# Patient Record
Sex: Male | Born: 1975 | Race: White | Hispanic: No | Marital: Married | Smoking: Current every day smoker
Health system: Southern US, Community
[De-identification: ages and names within clinical notes are randomized; demographics above are authoritative.]

## PROBLEM LIST (undated history)

## (undated) DIAGNOSIS — E7800 Pure hypercholesterolemia, unspecified: Secondary | ICD-10-CM

## (undated) DIAGNOSIS — K429 Umbilical hernia without obstruction or gangrene: Secondary | ICD-10-CM

## (undated) DIAGNOSIS — I10 Essential (primary) hypertension: Secondary | ICD-10-CM

## (undated) DIAGNOSIS — E119 Type 2 diabetes mellitus without complications: Secondary | ICD-10-CM

## (undated) DIAGNOSIS — G809 Cerebral palsy, unspecified: Secondary | ICD-10-CM

## (undated) DIAGNOSIS — M1990 Unspecified osteoarthritis, unspecified site: Secondary | ICD-10-CM

## (undated) HISTORY — PX: HEEL SPUR SURGERY: SHX665

## (undated) HISTORY — PX: APPENDECTOMY: SHX54

---

## 2004-11-25 ENCOUNTER — Emergency Department (HOSPITAL_COMMUNITY): Admission: EM | Admit: 2004-11-25 | Discharge: 2004-11-25 | Payer: Self-pay | Admitting: Emergency Medicine

## 2004-11-28 ENCOUNTER — Ambulatory Visit: Payer: Self-pay | Admitting: Nurse Practitioner

## 2005-05-06 ENCOUNTER — Emergency Department (HOSPITAL_COMMUNITY): Admission: EM | Admit: 2005-05-06 | Discharge: 2005-05-06 | Payer: Self-pay | Admitting: Emergency Medicine

## 2005-08-15 ENCOUNTER — Ambulatory Visit: Payer: Self-pay | Admitting: Family Medicine

## 2005-08-20 ENCOUNTER — Ambulatory Visit (HOSPITAL_COMMUNITY): Admission: RE | Admit: 2005-08-20 | Discharge: 2005-08-20 | Payer: Self-pay | Admitting: Family Medicine

## 2005-10-08 ENCOUNTER — Ambulatory Visit: Payer: Self-pay | Admitting: Family Medicine

## 2005-10-16 ENCOUNTER — Ambulatory Visit: Admission: RE | Admit: 2005-10-16 | Discharge: 2005-10-16 | Payer: Self-pay | Admitting: Family Medicine

## 2005-10-22 ENCOUNTER — Ambulatory Visit: Payer: Self-pay | Admitting: Family Medicine

## 2006-02-04 ENCOUNTER — Emergency Department (HOSPITAL_COMMUNITY): Admission: EM | Admit: 2006-02-04 | Discharge: 2006-02-04 | Payer: Self-pay | Admitting: Emergency Medicine

## 2006-02-09 ENCOUNTER — Emergency Department (HOSPITAL_COMMUNITY): Admission: EM | Admit: 2006-02-09 | Discharge: 2006-02-09 | Payer: Self-pay | Admitting: Emergency Medicine

## 2006-10-29 ENCOUNTER — Emergency Department (HOSPITAL_COMMUNITY): Admission: EM | Admit: 2006-10-29 | Discharge: 2006-10-29 | Payer: Self-pay | Admitting: Emergency Medicine

## 2006-12-20 ENCOUNTER — Emergency Department (HOSPITAL_COMMUNITY): Admission: EM | Admit: 2006-12-20 | Discharge: 2006-12-20 | Payer: Self-pay | Admitting: Emergency Medicine

## 2007-04-11 ENCOUNTER — Emergency Department (HOSPITAL_COMMUNITY): Admission: EM | Admit: 2007-04-11 | Discharge: 2007-04-11 | Payer: Self-pay | Admitting: Emergency Medicine

## 2007-04-27 ENCOUNTER — Emergency Department (HOSPITAL_COMMUNITY): Admission: EM | Admit: 2007-04-27 | Discharge: 2007-04-27 | Payer: Self-pay | Admitting: Emergency Medicine

## 2007-05-27 ENCOUNTER — Ambulatory Visit: Admission: RE | Admit: 2007-05-27 | Discharge: 2007-05-27 | Payer: Self-pay | Admitting: Internal Medicine

## 2007-07-04 ENCOUNTER — Emergency Department (HOSPITAL_COMMUNITY): Admission: EM | Admit: 2007-07-04 | Discharge: 2007-07-05 | Payer: Self-pay | Admitting: Emergency Medicine

## 2007-07-12 ENCOUNTER — Emergency Department (HOSPITAL_COMMUNITY): Admission: EM | Admit: 2007-07-12 | Discharge: 2007-07-12 | Payer: Self-pay | Admitting: Emergency Medicine

## 2007-09-04 ENCOUNTER — Emergency Department (HOSPITAL_COMMUNITY): Admission: EM | Admit: 2007-09-04 | Discharge: 2007-09-04 | Payer: Self-pay | Admitting: Emergency Medicine

## 2007-11-04 ENCOUNTER — Emergency Department (HOSPITAL_COMMUNITY): Admission: EM | Admit: 2007-11-04 | Discharge: 2007-11-04 | Payer: Self-pay | Admitting: Emergency Medicine

## 2008-01-22 ENCOUNTER — Emergency Department (HOSPITAL_COMMUNITY): Admission: EM | Admit: 2008-01-22 | Discharge: 2008-01-22 | Payer: Self-pay | Admitting: Emergency Medicine

## 2008-03-28 ENCOUNTER — Emergency Department (HOSPITAL_COMMUNITY): Admission: EM | Admit: 2008-03-28 | Discharge: 2008-03-28 | Payer: Self-pay | Admitting: Emergency Medicine

## 2008-07-12 ENCOUNTER — Emergency Department (HOSPITAL_COMMUNITY): Admission: EM | Admit: 2008-07-12 | Discharge: 2008-07-12 | Payer: Self-pay | Admitting: Emergency Medicine

## 2008-08-03 ENCOUNTER — Emergency Department (HOSPITAL_COMMUNITY): Admission: EM | Admit: 2008-08-03 | Discharge: 2008-08-03 | Payer: Self-pay | Admitting: Emergency Medicine

## 2008-08-11 ENCOUNTER — Emergency Department (HOSPITAL_COMMUNITY): Admission: EM | Admit: 2008-08-11 | Discharge: 2008-08-11 | Payer: Self-pay | Admitting: Emergency Medicine

## 2008-10-28 ENCOUNTER — Emergency Department (HOSPITAL_COMMUNITY): Admission: EM | Admit: 2008-10-28 | Discharge: 2008-10-28 | Payer: Self-pay | Admitting: Emergency Medicine

## 2009-05-06 ENCOUNTER — Emergency Department (HOSPITAL_COMMUNITY): Admission: EM | Admit: 2009-05-06 | Discharge: 2009-05-06 | Payer: Self-pay | Admitting: Emergency Medicine

## 2010-04-18 ENCOUNTER — Emergency Department (HOSPITAL_COMMUNITY)
Admission: EM | Admit: 2010-04-18 | Discharge: 2010-04-18 | Payer: Self-pay | Source: Home / Self Care | Admitting: Emergency Medicine

## 2010-06-12 ENCOUNTER — Emergency Department (HOSPITAL_COMMUNITY)
Admission: EM | Admit: 2010-06-12 | Discharge: 2010-06-12 | Payer: Self-pay | Source: Home / Self Care | Admitting: Emergency Medicine

## 2010-06-19 LAB — URINALYSIS, ROUTINE W REFLEX MICROSCOPIC
Bilirubin Urine: NEGATIVE
Hgb urine dipstick: NEGATIVE
Ketones, ur: NEGATIVE mg/dL
Nitrite: NEGATIVE
Protein, ur: NEGATIVE mg/dL
Specific Gravity, Urine: >1.03 — ABNORMAL HIGH (ref 1.005–1.030)
Urine Glucose, Fasting: NEGATIVE mg/dL
Urobilinogen, UA: 2 mg/dL — ABNORMAL HIGH (ref 0.0–1.0)
pH: 6 (ref 5.0–8.0)

## 2010-06-19 LAB — CBC
HCT: 37.3 % — ABNORMAL LOW (ref 39.0–52.0)
Hemoglobin: 13.2 g/dL (ref 13.0–17.0)
MCH: 30.9 pg (ref 26.0–34.0)
MCHC: 35.4 g/dL (ref 30.0–36.0)
MCV: 87.4 fL (ref 78.0–100.0)
Platelets: 215 10*3/uL (ref 150–400)
RBC: 4.27 MIL/uL (ref 4.22–5.81)
RDW: 13.6 % (ref 11.5–15.5)
WBC: 6.8 10*3/uL (ref 4.0–10.5)

## 2010-06-19 LAB — DIFFERENTIAL
Basophils Absolute: 0 10*3/uL (ref 0.0–0.1)
Basophils Relative: 0 % (ref 0–1)
Eosinophils Absolute: 0.2 10*3/uL (ref 0.0–0.7)
Eosinophils Relative: 3 % (ref 0–5)
Lymphocytes Relative: 28 % (ref 12–46)
Lymphs Abs: 1.9 10*3/uL (ref 0.7–4.0)
Monocytes Absolute: 0.5 10*3/uL (ref 0.1–1.0)
Monocytes Relative: 7 % (ref 3–12)
Neutro Abs: 4.3 10*3/uL (ref 1.7–7.7)
Neutrophils Relative %: 63 % (ref 43–77)

## 2010-06-19 LAB — COMPREHENSIVE METABOLIC PANEL
ALT: 87 U/L — ABNORMAL HIGH (ref 0–53)
AST: 69 U/L — ABNORMAL HIGH (ref 0–37)
Albumin: 4 g/dL (ref 3.5–5.2)
Alkaline Phosphatase: 41 U/L (ref 39–117)
BUN: 11 mg/dL (ref 6–23)
CO2: 25 mEq/L (ref 19–32)
Calcium: 9 mg/dL (ref 8.4–10.5)
Chloride: 108 mEq/L (ref 96–112)
Creatinine, Ser: 0.9 mg/dL (ref 0.4–1.5)
GFR calc Af Amer: >60 mL/min (ref >60)
GFR calc non Af Amer: >60 mL/min (ref >60)
Glucose, Bld: 104 mg/dL — ABNORMAL HIGH (ref 70–99)
Potassium: 3.9 mEq/L (ref 3.5–5.1)
Sodium: 140 mEq/L (ref 135–145)
Total Bilirubin: 0.5 mg/dL (ref 0.3–1.2)
Total Protein: 7 g/dL (ref 6.0–8.3)

## 2010-06-19 LAB — BRAIN NATRIURETIC PEPTIDE: Pro B Natriuretic peptide (BNP): <30 pg/mL (ref 0.0–100.0)

## 2010-06-19 LAB — URIC ACID: Uric Acid, Serum: 7 mg/dL (ref 4.0–7.8)

## 2010-06-19 LAB — D-DIMER, QUANTITATIVE: D-Dimer, Quant: 0.3 ug/mL-FEU (ref 0.00–0.48)

## 2011-02-22 LAB — DIFFERENTIAL
Basophils Absolute: 0.1
Basophils Relative: 1
Eosinophils Absolute: 0.3
Eosinophils Relative: 3
Lymphocytes Relative: 35
Lymphs Abs: 3.1
Monocytes Absolute: 0.5
Monocytes Relative: 6
Neutro Abs: 4.9
Neutrophils Relative %: 55

## 2011-02-22 LAB — CBC
HCT: 44.1
Hemoglobin: 15.8
MCHC: 35.8
MCV: 87.9
Platelets: 263
RBC: 5.02
RDW: 13.1
WBC: 8.9

## 2011-02-22 LAB — BASIC METABOLIC PANEL
BUN: 10
CO2: 27
Calcium: 9.1
Chloride: 107
Creatinine, Ser: 0.81
GFR calc Af Amer: >60
GFR calc non Af Amer: >60
Glucose, Bld: 122 — ABNORMAL HIGH
Potassium: 3.3 — ABNORMAL LOW
Sodium: 140

## 2011-02-22 LAB — POCT CARDIAC MARKERS
CKMB, poc: <1 — ABNORMAL LOW
Myoglobin, poc: 81.2
Operator id: 179121
Troponin i, poc: <0.05

## 2011-03-16 DIAGNOSIS — H6120 Impacted cerumen, unspecified ear: Secondary | ICD-10-CM | POA: Insufficient documentation

## 2011-03-16 DIAGNOSIS — E119 Type 2 diabetes mellitus without complications: Secondary | ICD-10-CM | POA: Insufficient documentation

## 2011-03-17 ENCOUNTER — Encounter: Payer: Self-pay | Admitting: *Deleted

## 2011-03-17 ENCOUNTER — Emergency Department (HOSPITAL_COMMUNITY)
Admission: EM | Admit: 2011-03-17 | Discharge: 2011-03-17 | Disposition: A | Discharge status: Home / Self Care | Payer: Self-pay | Attending: Emergency Medicine | Admitting: Emergency Medicine

## 2011-03-17 DIAGNOSIS — H6123 Impacted cerumen, bilateral: Secondary | ICD-10-CM

## 2011-03-17 HISTORY — DX: Unspecified osteoarthritis, unspecified site: M19.90

## 2011-03-17 HISTORY — DX: Essential (primary) hypertension: I10

## 2011-03-17 HISTORY — DX: Pure hypercholesterolemia, unspecified: E78.00

## 2011-03-17 NOTE — ED Notes (Signed)
Pt c/o wax build up in both his ears which is making it hard to hear.

## 2011-03-17 NOTE — ED Notes (Signed)
Pt left er stating he wont sign discharge papers because the doctor didn't flush his ears. No noted distress

## 2011-03-17 NOTE — ED Provider Notes (Signed)
History     CSN: [id number] Arrival date & time: 03/17/2011 12:14 AM  Chief Complaint  Patient presents with  . Otalgia    The history is provided by the patient.   patient reports a complaining of buildup of wax in both ears ears were hard to hear.  He reports worsening hearing over the past couple days and therefore presents and requests cerumen removal.  Nothing worsens the symptoms.  Nothing improves the symptoms.  His symptoms are mild in severity.  They're constant  Past Medical History  Diagnosis Date  . Hypertension   . High cholesterol   . Premature birth   . Arthritis     Past Surgical History  Procedure Date  . Appendectomy   . Heel spur surgery     Family History  Problem Relation Age of Onset  . Diabetes Other   . Hypertension Other   . Rheum arthritis Other     History  Substance Use Topics  . Smoking status: Current Everyday Smoker  . Smokeless tobacco: Not on file  . Alcohol Use: No      Review of Systems  All other systems reviewed and are negative.    Allergies  Review of patient's allergies indicates not on file.  Home Medications   Current Outpatient Rx  Name Route Sig Dispense Refill  . LISINOPRIL 40 MG PO TABS Oral Take 40 mg by mouth daily.      Marland Kitchen UNKNOWN TO PATIENT         BP 136/79  Pulse 81  Temp(Src) 97.6 F (36.4 C) (Oral)  Resp 20  Wt 250 lb (113.399 kg)  SpO2 97%  Physical Exam  Constitutional: He is oriented to person, place, and time. He appears well-developed and well-nourished.  HENT:  Head: Normocephalic.       Bilateral cerumen impaction right greater than left  Eyes: EOM are normal.  Neck: Normal range of motion.  Pulmonary/Chest: Effort normal.  Musculoskeletal: Normal range of motion.  Neurological: He is alert and oriented to person, place, and time.  Psychiatric: He has a normal mood and affect.    ED Course  EAR CERUMEN REMOVAL Performed by: Lyanne Co Authorized by: Lyanne Co Consent: Verbal consent obtained. Consent given by: patient Patient understanding: patient states understanding of the procedure being performed Required items: required blood products, implants, devices, and special equipment available Patient identity confirmed: arm band Local anesthetic: none Location details: right ear Procedure type: curette Patient sedated: no Comments: The patient tolerated the procedure in the early parts but then began to have too much pain to allow for forward move cerumen on the right.  He refused cerumen removal on the left   (including critical care time)  Labs Reviewed - No data to display No results found.   1. Impacted cerumen of both ears       MDM  A large amount of cerumen was removed from the right side however there was poor to obtain but the patient refused additional wax removal.  He was offered cerumen removal with curette on left as well however he declined.  He requested irrigation however due to the volume in the emergency department as well as the acuity of patient's it was deemed not adequate use of staff.  I recommended that the patient followup with his primary care doctor for further removal in the office.  The patient was upset however I did not feel as though at other options for him  at this time      Lyanne Co, MD 03/17/11 [phone]

## 2011-06-06 ENCOUNTER — Encounter (HOSPITAL_COMMUNITY): Payer: Self-pay | Admitting: Emergency Medicine

## 2011-06-06 DIAGNOSIS — Z79899 Other long term (current) drug therapy: Secondary | ICD-10-CM | POA: Insufficient documentation

## 2011-06-06 DIAGNOSIS — R21 Rash and other nonspecific skin eruption: Secondary | ICD-10-CM | POA: Insufficient documentation

## 2011-06-06 DIAGNOSIS — F17200 Nicotine dependence, unspecified, uncomplicated: Secondary | ICD-10-CM | POA: Insufficient documentation

## 2011-06-06 DIAGNOSIS — M129 Arthropathy, unspecified: Secondary | ICD-10-CM | POA: Insufficient documentation

## 2011-06-06 DIAGNOSIS — L299 Pruritus, unspecified: Secondary | ICD-10-CM | POA: Insufficient documentation

## 2011-06-06 DIAGNOSIS — L02818 Cutaneous abscess of other sites: Secondary | ICD-10-CM | POA: Insufficient documentation

## 2011-06-06 DIAGNOSIS — E789 Disorder of lipoprotein metabolism, unspecified: Secondary | ICD-10-CM | POA: Insufficient documentation

## 2011-06-06 DIAGNOSIS — L03818 Cellulitis of other sites: Secondary | ICD-10-CM | POA: Insufficient documentation

## 2011-06-06 DIAGNOSIS — I10 Essential (primary) hypertension: Secondary | ICD-10-CM | POA: Insufficient documentation

## 2011-06-06 DIAGNOSIS — L989 Disorder of the skin and subcutaneous tissue, unspecified: Secondary | ICD-10-CM | POA: Insufficient documentation

## 2011-06-06 DIAGNOSIS — M79609 Pain in unspecified limb: Secondary | ICD-10-CM | POA: Insufficient documentation

## 2011-06-06 NOTE — ED Notes (Signed)
Knot on left side of head x 1 week  (temporal area)   Left foot ulcerated area below the medial mallelous x 2 month (itches and drains sometimes) no known injury

## 2011-06-07 ENCOUNTER — Emergency Department (HOSPITAL_COMMUNITY)
Admission: EM | Admit: 2011-06-07 | Discharge: 2011-06-07 | Disposition: A | Discharge status: Home / Self Care | Payer: Medicaid Other | Attending: Emergency Medicine | Admitting: Emergency Medicine

## 2011-06-07 DIAGNOSIS — L02811 Cutaneous abscess of head [any part, except face]: Secondary | ICD-10-CM

## 2011-06-07 MED ORDER — KETOROLAC TROMETHAMINE 60 MG/2ML IM SOLN
60.0000 mg | Freq: Once | INTRAMUSCULAR | Status: AC
  Administered 2011-06-07: 60 mg via INTRAMUSCULAR
  Filled 2011-06-07: qty 2

## 2011-06-07 MED ORDER — LIDOCAINE HCL (PF) 1 % IJ SOLN
INTRAMUSCULAR | Status: AC
  Filled 2011-06-07: qty 5

## 2011-06-07 MED ORDER — NAPROXEN 500 MG PO TABS: 500.0000 mg | ORAL_TABLET | Freq: Two times a day (BID) | ORAL | Status: AC

## 2011-06-07 NOTE — ED Notes (Signed)
Knot noted to left temple of patient's head.  Pt denies fall or injury, but pt states he shaved his head and may have nicked the area prior to knot coming up.  Pt does not recall how long ago he shaved hair.

## 2011-06-07 NOTE — ED Provider Notes (Signed)
History     CSN: [id number]  Arrival date & time 06/06/11  2146   First MD Initiated Contact with Patient 06/07/11 0031      Chief Complaint  Patient presents with  . Skin Ulcer  . Recurrent Skin Infections    (Consider location/radiation/quality/duration/timing/severity/associated sxs/prior treatment) HPI Comments: 36 year old male with a history of cerebral palsy , hypertension, arthritis who presents with 2 complaints  #1 swollen lesion to the left frontal temporal area which started one week ago has gradually gotten worse and is associated with drainage of a wet material onto his hair but is not associated with fevers nausea or vomiting. Symptoms are mild and it is nontender to palpation  #2, left medial proximal foot, nonhealing lesion for 2 months, mild tenderness to palpation, sometimes itchy, not draining.  Patient is wheelchair-bound due to inability to ambulate safely do to cerebral palsy.  The history is provided by the patient.    Past Medical History  Diagnosis Date  . Hypertension   . High cholesterol   . Premature birth   . Arthritis     Past Surgical History  Procedure Date  . Appendectomy   . Heel spur surgery     Family History  Problem Relation Age of Onset  . Diabetes Other   . Hypertension Other   . Rheum arthritis Other     History  Substance Use Topics  . Smoking status: Current Everyday Smoker  . Smokeless tobacco: Not on file  . Alcohol Use: No      Review of Systems  All other systems reviewed and are negative.    Allergies  Review of patient's allergies indicates no known allergies.  Home Medications   Current Outpatient Rx  Name Route Sig Dispense Refill  . LISINOPRIL 40 MG PO TABS Oral Take 40 mg by mouth daily.      Marland Kitchen UNKNOWN TO PATIENT       . NAPROXEN 500 MG PO TABS Oral Take 1 tablet (500 mg total) by mouth 2 (two) times daily with a meal. 30 tablet 0    BP 144/99  Pulse 73  Temp(Src) 98.2 F (36.8 C) (Oral)   Resp 20  Ht 5\' 7"  (1.702 m)  Wt 156 lb (70.761 kg)  BMI 24.43 kg/m2  SpO2 100%  Physical Exam  Nursing note and vitals reviewed. Constitutional: He appears well-developed and well-nourished. No distress.  HENT:  Head: Normocephalic.  Mouth/Throat: Oropharynx is clear and moist. No oropharyngeal exudate.       2 cm fluctuant soft nontender lesion to the frontotemporal scalp, no hair follicles overlying this area  Eyes: Conjunctivae and EOM are normal. Pupils are equal, round, and reactive to light. Right eye exhibits no discharge. Left eye exhibits no discharge. No scleral icterus.  Neck: Normal range of motion. Neck supple. No JVD present. No thyromegaly present.  Cardiovascular: Normal rate, regular rhythm, normal heart sounds and intact distal pulses.  Exam reveals no gallop and no friction rub.   No murmur heard. Pulmonary/Chest: Effort normal and breath sounds normal. No respiratory distress. He has no wheezes. He has no rales.  Abdominal: Soft. Bowel sounds are normal. He exhibits no distension and no mass. There is no tenderness.  Musculoskeletal: Normal range of motion. He exhibits no edema and no tenderness.  Lymphadenopathy:    He has no cervical adenopathy.  Neurological: He is alert. Coordination normal.  Skin: Skin is warm and dry. Rash noted. No erythema.  Mild scaling crusting dry lesion to the medial left foot just distal and inferior, posterior to the medial malleoli  Psychiatric: He has a normal mood and affect. His behavior is normal.    ED Course  Procedures (including critical care time)  INCISION AND DRAINAGE Performed by: Eber Hong D Consent: Verbal consent obtained. Risks and benefits: risks, benefits and alternatives were discussed Type: abscess  Body area: left frontal temporal scalp  Anesthesia: local infiltration  Local anesthetic: lidocaine 1%% without epinephrine  Anesthetic total: 0.5 ml  Complexity: complex Blunt dissection to  break up loculations  Drainage: purulent  Drainage amount: small-to-moderate  Packing material: 1/4 in iodoform gauze  Patient tolerance: Patient tolerated the procedure well with no immediate complications.     Labs Reviewed - No data to display No results found.   1. Abscess of scalp       MDM  Patient likely has some peripheral vascular insufficiency of the left foot, there is no signs of bowel perfusion of the foot as the toes are all well perfused. There is no erythema to suggest cellulitis. The swollen abscess-like lesion of the scalp will need incision and drainage. It is too soft and fluctuant to be a dermoid cyst.  No surrounding erythema, no fevers to suggest significant infection, abscess drained, packing placed, patient informed of close followup        Vida Roller, MD 06/07/11 0150

## 2011-06-07 NOTE — ED Notes (Signed)
Patient is alert and oriented x 4 with respirations even and unlabored.  NAD at this time.  Discharge instructions reviewed with patient and patient verbalized understanding.  Pt transported to lobby via wheelchair, and caregiver to transport pt home.

## 2011-06-07 NOTE — ED Notes (Signed)
Set up I and D for Dr. Hyacinth Meeker to lance place on patients head.

## 2011-06-07 NOTE — ED Notes (Signed)
Scab with dark center noted to left inner ankle.  Pt reports area evaluated by EDP.  Pt reports soreness to area with standing.  Left foot is warm and dry with pulse palpable.

## 2012-05-29 ENCOUNTER — Encounter (HOSPITAL_COMMUNITY): Payer: Self-pay | Admitting: Emergency Medicine

## 2012-05-29 ENCOUNTER — Emergency Department (HOSPITAL_COMMUNITY)
Admission: EM | Admit: 2012-05-29 | Discharge: 2012-05-29 | Disposition: A | Discharge status: Home / Self Care | Payer: Medicaid Other | Attending: Emergency Medicine | Admitting: Emergency Medicine

## 2012-05-29 DIAGNOSIS — I10 Essential (primary) hypertension: Secondary | ICD-10-CM | POA: Insufficient documentation

## 2012-05-29 DIAGNOSIS — E7800 Pure hypercholesterolemia, unspecified: Secondary | ICD-10-CM | POA: Insufficient documentation

## 2012-05-29 DIAGNOSIS — R109 Unspecified abdominal pain: Secondary | ICD-10-CM

## 2012-05-29 DIAGNOSIS — M129 Arthropathy, unspecified: Secondary | ICD-10-CM | POA: Insufficient documentation

## 2012-05-29 DIAGNOSIS — Z79899 Other long term (current) drug therapy: Secondary | ICD-10-CM | POA: Insufficient documentation

## 2012-05-29 DIAGNOSIS — M549 Dorsalgia, unspecified: Secondary | ICD-10-CM | POA: Insufficient documentation

## 2012-05-29 DIAGNOSIS — F17200 Nicotine dependence, unspecified, uncomplicated: Secondary | ICD-10-CM | POA: Insufficient documentation

## 2012-05-29 LAB — URINALYSIS, ROUTINE W REFLEX MICROSCOPIC
Glucose, UA: NEGATIVE mg/dL
Leukocytes, UA: NEGATIVE
Nitrite: NEGATIVE
Protein, ur: NEGATIVE mg/dL
pH: 6.5 (ref 5.0–8.0)

## 2012-05-29 NOTE — ED Notes (Signed)
Offered fluids to encourage voiding for urine specimen

## 2012-05-29 NOTE — ED Notes (Signed)
Drove himself here via his motorized wheelchair.  States he dropped his cell phone somewhere en route.  Has other complaints also - states ulcer on heel.  Taking PCN for dental carries and will have tooth extracted after completed

## 2012-05-29 NOTE — ED Provider Notes (Signed)
History     CSN: [id number]  Arrival date & time 05/29/12  4098   First MD Initiated Contact with Patient 05/29/12 [phone]      Chief Complaint  Patient presents with  . Chest Pain    pain left side rib area    (Consider location/radiation/quality/duration/timing/severity/associated sxs/prior treatment) HPI Bruce Love is a 36 y.o. male with a h/o DM, HTN, RA, uses a motorized wheelchair,  who presents to the Emergency Department complaining of left sided abdominal pain that woke him from sleep. He is currently on antibiotic for dental pain. He has no history of kidney stones. Last BM was at 8 PM last night and was normal.    Past Medical History  Diagnosis Date  . Hypertension   . High cholesterol   . Premature birth   . Arthritis     Past Surgical History  Procedure Date  . Appendectomy   . Heel spur surgery     Family History  Problem Relation Age of Onset  . Diabetes Other   . Hypertension Other   . Rheum arthritis Other     History  Substance Use Topics  . Smoking status: Current Every Day Smoker -- 0.5 packs/day  . Smokeless tobacco: Not on file  . Alcohol Use: No      Review of Systems  Constitutional: Negative for fever.       10 Systems reviewed and are negative for acute change except as noted in the HPI.  HENT: Negative for congestion.   Eyes: Negative for discharge and redness.  Respiratory: Negative for cough and shortness of breath.   Cardiovascular: Negative for chest pain.  Gastrointestinal: Negative for vomiting and abdominal pain.  Musculoskeletal: Positive for back pain.       Pain to left side of abdomen  Skin: Negative for rash.  Neurological: Negative for syncope, numbness and headaches.  Psychiatric/Behavioral:       No behavior change.    Allergies  Review of patient's allergies indicates no known allergies.  Home Medications   Current Outpatient Rx  Name  Route  Sig  Dispense  Refill  . AMOXICILLIN 500 MG PO CAPS  Oral   Take 500 mg by mouth 3 (three) times daily.         Marland Kitchen LISINOPRIL 40 MG PO TABS   Oral   Take 40 mg by mouth daily.           Marland Kitchen UNKNOWN TO PATIENT                . NAPROXEN 500 MG PO TABS   Oral   Take 1 tablet (500 mg total) by mouth 2 (two) times daily with a meal.   30 tablet   0     BP 159/89  Pulse 77  Temp 97.7 F (36.5 C) (Oral)  Resp 20  Ht 5\' 7"  (1.702 m)  Wt 210 lb (95.255 kg)  BMI 32.89 kg/m2  SpO2 99%  Physical Exam  Nursing note and vitals reviewed. Constitutional: He appears well-developed and well-nourished.       Awake, alert, nontoxic appearance.  HENT:  Head: Normocephalic and atraumatic.  Right Ear: External ear normal.  Left Ear: External ear normal.  Eyes: EOM are normal. Pupils are equal, round, and reactive to light. Right eye exhibits no discharge. Left eye exhibits no discharge.  Neck: Neck supple.  Cardiovascular: Normal rate, normal heart sounds and intact distal pulses.   Pulmonary/Chest: Effort normal and breath  sounds normal. No respiratory distress. He has no wheezes. He exhibits no tenderness.  Abdominal: Soft. There is no tenderness. There is no rebound.  Musculoskeletal: He exhibits no tenderness.       Baseline ROM, no obvious new focal weakness.  Neurological:       Mental status and motor strength appears baseline for patient and situation.  Skin: No rash noted.  Psychiatric: He has a normal mood and affect.    ED Course  Procedures (including critical care time) Results for orders placed during the hospital encounter of 05/29/12  URINALYSIS, ROUTINE W REFLEX MICROSCOPIC      Component Value Range   Color, Urine YELLOW  YELLOW   APPearance CLEAR  CLEAR   Specific Gravity, Urine 1.020  1.005 - 1.030   pH 6.5  5.0 - 8.0   Glucose, UA NEGATIVE  NEGATIVE mg/dL   Hgb urine dipstick NEGATIVE  NEGATIVE   Bilirubin Urine NEGATIVE  NEGATIVE   Ketones, ur NEGATIVE  NEGATIVE mg/dL   Protein, ur NEGATIVE  NEGATIVE  mg/dL   Urobilinogen, UA 1.0  0.0 - 1.0 mg/dL   Nitrite NEGATIVE  NEGATIVE   Leukocytes, UA NEGATIVE  NEGATIVE       MDM  Patient with left sided abdominal pain that woke him from sleep. Pain subsided without intervention. UA normal. Reviewed results with the patient. Pt stable in ED with no significant deterioration in condition.The patient appears reasonably screened and/or stabilized for discharge and I doubt any other medical condition or other [facility] requiring further screening, evaluation, or treatment in the ED at this time prior to discharge.  MDM Reviewed: nursing note and vitals Interpretation: labs           Nicoletta Dress. Colon Branch, MD 05/29/12 [phone]

## 2012-08-26 ENCOUNTER — Ambulatory Visit (HOSPITAL_COMMUNITY): Admission: RE | Admit: 2012-08-26 | Payer: Medicaid Other | Source: Ambulatory Visit | Admitting: Physical Therapy

## 2012-10-22 ENCOUNTER — Emergency Department (HOSPITAL_COMMUNITY)
Admission: EM | Admit: 2012-10-22 | Discharge: 2012-10-23 | Disposition: A | Discharge status: Home / Self Care | Payer: Medicaid Other | Attending: Emergency Medicine | Admitting: Emergency Medicine

## 2012-10-22 ENCOUNTER — Encounter (HOSPITAL_COMMUNITY): Payer: Self-pay | Admitting: Emergency Medicine

## 2012-10-22 DIAGNOSIS — Z79899 Other long term (current) drug therapy: Secondary | ICD-10-CM | POA: Insufficient documentation

## 2012-10-22 DIAGNOSIS — Z8639 Personal history of other endocrine, nutritional and metabolic disease: Secondary | ICD-10-CM | POA: Insufficient documentation

## 2012-10-22 DIAGNOSIS — I10 Essential (primary) hypertension: Secondary | ICD-10-CM | POA: Insufficient documentation

## 2012-10-22 DIAGNOSIS — F17200 Nicotine dependence, unspecified, uncomplicated: Secondary | ICD-10-CM | POA: Insufficient documentation

## 2012-10-22 DIAGNOSIS — R599 Enlarged lymph nodes, unspecified: Secondary | ICD-10-CM | POA: Insufficient documentation

## 2012-10-22 DIAGNOSIS — Z8739 Personal history of other diseases of the musculoskeletal system and connective tissue: Secondary | ICD-10-CM | POA: Insufficient documentation

## 2012-10-22 DIAGNOSIS — Z862 Personal history of diseases of the blood and blood-forming organs and certain disorders involving the immune mechanism: Secondary | ICD-10-CM | POA: Insufficient documentation

## 2012-10-22 DIAGNOSIS — R590 Localized enlarged lymph nodes: Secondary | ICD-10-CM

## 2012-10-22 MED ORDER — PENICILLIN V POTASSIUM 250 MG PO TABS
500.0000 mg | ORAL_TABLET | Freq: Once | ORAL | Status: AC
  Administered 2012-10-22: 500 mg via ORAL
  Filled 2012-10-22: qty 2

## 2012-10-22 MED ORDER — PENICILLIN V POTASSIUM 500 MG PO TABS: 500.0000 mg | ORAL_TABLET | Freq: Four times a day (QID) | ORAL | Status: AC

## 2012-10-22 NOTE — ED Notes (Signed)
Patient reports noticed a swollen knot to left side of throat after eating a bologna sandwich tonight. Denies difficulty swallowing or breathing.

## 2012-10-22 NOTE — ED Provider Notes (Signed)
History     CSN: [id number]  Arrival date & time 10/22/12  2247   First MD Initiated Contact with Patient 10/22/12 2327      Chief Complaint  Patient presents with  . Oral Swelling    (Consider location/radiation/quality/duration/timing/severity/associated sxs/prior treatment) HPI HPI Comments: Bruce Love is a 37 y.o. male who presents to the Emergency Department complaining of swelling of the lymph node to the left neck that began today. Painful to touch. No obvious lesions in the mouth, face or head. Denies fever, chills, cough. Past Medical History  Diagnosis Date  . Hypertension   . High cholesterol   . Premature birth   . Arthritis     Past Surgical History  Procedure Laterality Date  . Appendectomy    . Heel spur surgery      Family History  Problem Relation Age of Onset  . Diabetes Other   . Hypertension Other   . Rheum arthritis Other     History  Substance Use Topics  . Smoking status: Current Every Day Smoker -- 0.50 packs/day  . Smokeless tobacco: Not on file  . Alcohol Use: No      Review of Systems  Constitutional: Negative for fever.       10 Systems reviewed and are negative for acute change except as noted in the HPI.  HENT: Negative for congestion.        Lymph node in left neck  Eyes: Negative for discharge and redness.  Respiratory: Negative for cough and shortness of breath.   Cardiovascular: Negative for chest pain.  Gastrointestinal: Negative for vomiting and abdominal pain.  Musculoskeletal: Negative for back pain.  Skin: Negative for rash.  Neurological: Negative for syncope, numbness and headaches.  Psychiatric/Behavioral:       No behavior change.    Allergies  Review of patient's allergies indicates no known allergies.  Home Medications   Current Outpatient Rx  Name  Route  Sig  Dispense  Refill  . amoxicillin (AMOXIL) 500 MG capsule   Oral   Take 500 mg by mouth 3 (three) times daily.         Marland Kitchen lisinopril  (PRINIVIL,ZESTRIL) 40 MG tablet   Oral   Take 40 mg by mouth daily.           Marland Kitchen UNKNOWN TO PATIENT                  BP 137/85  Pulse 82  Temp(Src) 97.3 F (36.3 C) (Oral)  Resp 20  SpO2 98%  Physical Exam  Nursing note and vitals reviewed. Constitutional: He appears well-developed and well-nourished.  Awake, alert, nontoxic appearance.  HENT:  Head: Normocephalic and atraumatic.  Swollen, tender lymph nodes in the anterior chain on left side. Shoddy node on right side  Eyes: EOM are normal. Pupils are equal, round, and reactive to light.  Neck: Neck supple.  Cardiovascular: Normal rate and intact distal pulses.   Pulmonary/Chest: Effort normal and breath sounds normal. He exhibits no tenderness.  Abdominal: Soft. Bowel sounds are normal. There is no tenderness. There is no rebound.  Musculoskeletal: He exhibits no tenderness.  Baseline ROM, no obvious new focal weakness.Patient uses a wheelchair.   Neurological:  Mental status and motor strength appears baseline for patient and situation.  Skin: No rash noted.  Psychiatric: He has a normal mood and affect.    ED Course  Procedures (including critical care time)    MDM  Patient with swollen lymph  node on left side of neck. No obvious wounds or lesions to mouth, face, scalp. Given penicillin VK 500 mg. Pt stable in ED with no significant deterioration in condition.The patient appears reasonably screened and/or stabilized for discharge and I doubt any other medical condition or other [facility] requiring further screening, evaluation, or treatment in the ED at this time prior to discharge.  MDM Reviewed: nursing note and vitals           Nicoletta Dress. Colon Branch, MD 10/22/12 2340

## 2013-01-12 ENCOUNTER — Emergency Department (HOSPITAL_COMMUNITY): Payer: Medicaid Other

## 2013-01-12 ENCOUNTER — Encounter (HOSPITAL_COMMUNITY): Payer: Self-pay | Admitting: Emergency Medicine

## 2013-01-12 ENCOUNTER — Emergency Department (HOSPITAL_COMMUNITY)
Admission: EM | Admit: 2013-01-12 | Discharge: 2013-01-12 | Disposition: A | Discharge status: Home / Self Care | Payer: Medicaid Other | Attending: Emergency Medicine | Admitting: Emergency Medicine

## 2013-01-12 DIAGNOSIS — L97409 Non-pressure chronic ulcer of unspecified heel and midfoot with unspecified severity: Secondary | ICD-10-CM | POA: Insufficient documentation

## 2013-01-12 DIAGNOSIS — E7800 Pure hypercholesterolemia, unspecified: Secondary | ICD-10-CM | POA: Insufficient documentation

## 2013-01-12 DIAGNOSIS — I10 Essential (primary) hypertension: Secondary | ICD-10-CM | POA: Insufficient documentation

## 2013-01-12 DIAGNOSIS — Z79899 Other long term (current) drug therapy: Secondary | ICD-10-CM | POA: Insufficient documentation

## 2013-01-12 DIAGNOSIS — F17200 Nicotine dependence, unspecified, uncomplicated: Secondary | ICD-10-CM | POA: Insufficient documentation

## 2013-01-12 DIAGNOSIS — Z9889 Other specified postprocedural states: Secondary | ICD-10-CM | POA: Insufficient documentation

## 2013-01-12 DIAGNOSIS — M129 Arthropathy, unspecified: Secondary | ICD-10-CM | POA: Insufficient documentation

## 2013-01-12 DIAGNOSIS — L97529 Non-pressure chronic ulcer of other part of left foot with unspecified severity: Secondary | ICD-10-CM

## 2013-01-12 MED ORDER — CLINDAMYCIN HCL 150 MG PO CAPS: 450.0000 mg | ORAL_CAPSULE | Freq: Three times a day (TID) | ORAL | Status: DC

## 2013-01-12 MED ORDER — CLINDAMYCIN HCL 150 MG PO CAPS
450.0000 mg | ORAL_CAPSULE | Freq: Once | ORAL | Status: AC
  Administered 2013-01-12: 450 mg via ORAL
  Filled 2013-01-12: qty 3

## 2013-01-12 NOTE — ED Notes (Signed)
Pt has wound to the inner left ankle.

## 2013-01-12 NOTE — ED Notes (Signed)
Old wound to left inner ankle for 2 years, pt sees wound center in [place] for it

## 2013-01-12 NOTE — ED Notes (Signed)
MD at bedside. 

## 2013-01-12 NOTE — ED Provider Notes (Signed)
CSN: [id number]     Arrival date & time 01/12/13  2014 History     First MD Initiated Contact with Patient 01/12/13 2022     Chief Complaint  Patient presents with  . Foot Pain   (Consider location/radiation/quality/duration/timing/severity/associated sxs/prior Treatment) HPI Comments: Chronic wound to L foot with some redness and yellow blisters surrounding the foot.  Patient is a 37 y.o. male presenting with lower extremity pain. The history is provided by the patient.  Foot Pain This is a new problem. The current episode started 2 days ago. The problem occurs constantly. The problem has been gradually worsening. Pertinent negatives include no chest pain, no abdominal pain, no headaches and no shortness of breath. Nothing aggravates the symptoms. Nothing relieves the symptoms. He has tried nothing for the symptoms.    Past Medical History  Diagnosis Date  . Hypertension   . High cholesterol   . Premature birth   . Arthritis    Past Surgical History  Procedure Laterality Date  . Appendectomy    . Heel spur surgery     Family History  Problem Relation Age of Onset  . Diabetes Other   . Hypertension Other   . Rheum arthritis Other    History  Substance Use Topics  . Smoking status: Current Every Day Smoker -- 0.50 packs/day  . Smokeless tobacco: Not on file  . Alcohol Use: No    Review of Systems  Constitutional: Negative for fever.  Respiratory: Negative for cough and shortness of breath.   Cardiovascular: Negative for chest pain.  Gastrointestinal: Negative for abdominal pain.  Neurological: Negative for headaches.  All other systems reviewed and are negative.    Allergies  Pravastatin  Home Medications   Current Outpatient Rx  Name  Route  Sig  Dispense  Refill  . acetaminophen (TYLENOL) 500 MG tablet   Oral   Take 500 mg by mouth every 6 (six) hours as needed for pain.         . Choline Fenofibrate (TRILIPIX) 135 MG capsule   Oral   Take 135 mg  by mouth at bedtime.          Marland Kitchen lisinopril (PRINIVIL,ZESTRIL) 40 MG tablet   Oral   Take 40 mg by mouth daily.           Marland Kitchen triamcinolone cream (KENALOG) 0.1 %   Topical   Apply 1 application topically 2 (two) times daily. APPLIED TO LEFT FOOT          BP 150/89  Pulse 77  Temp(Src) 97.9 F (36.6 C) (Oral)  Resp 20  Ht 5\' 7"  (1.702 m)  Wt 200 lb (90.719 kg)  BMI 31.32 kg/m2  SpO2 96% Physical Exam  Nursing note and vitals reviewed. Constitutional: He is oriented to person, place, and time. He appears well-developed and well-nourished. No distress.  HENT:  Head: Normocephalic and atraumatic.  Mouth/Throat: No oropharyngeal exudate.  Eyes: EOM are normal. Pupils are equal, round, and reactive to light.  Neck: Normal range of motion. Neck supple.  Cardiovascular: Normal rate and regular rhythm.  Exam reveals no friction rub.   No murmur heard. Pulmonary/Chest: Effort normal and breath sounds normal. No respiratory distress. He has no wheezes. He has no rales.  Abdominal: He exhibits no distension. There is no tenderness. There is no rebound.  Musculoskeletal: Normal range of motion. He exhibits no edema.       Feet:  Neurological: He is alert and oriented to person, place,  and time.  Skin: He is not diaphoretic.    ED Course   Procedures (including critical care time)  Labs Reviewed - No data to display Dg Foot Complete Left  01/12/2013   *RADIOLOGY REPORT*  Clinical Data: Foot pain  LEFT FOOT - COMPLETE 3+ VIEW  Comparison: [facility] foot radiograph 07/22/2012  Findings: Mild hallux valgus deformity noted.  Positioning is suboptimal due to patient contraction and immobility.  No radiopaque foreign body.  No fracture or dislocation identified.  IMPRESSION: No acute osseous abnormality.   Original Report Authenticated By: Christiana Pellant, M.D.   1. Foot ulcer, left     MDM   37 year old male with history of hypertension presents with a left medial  heel wound that he is concerned is infected. He has been followed by the wound Center 2 years previously for a wound that would not heal after he ran to his wife's electrical chair with his own electric wheelchair. He has been released from the wound Center for some time as well as looking good. He's been putting antibiotic cream on the wound, however started to have some mild redness and tenderness at the site. It is not draining. There is no surrounding cellulitis. He is not have a history of diabetes. He is not running fevers or having nausea or vomiting or any other systemic symptoms.  Vitals are stable here. His left medial foot/heel has a wound on it. It is tender to touch, what is not have any surrounding cellulitis. Wound has some encircling yellow crust, however this flakes off easily and seems more c/w build up of his antibacterial cream rather than frank pus or drainage. He does not have any drainage from the wound. I do not feel like he needs IV antibiotics at this time. I will give him clindamycin. X-rays normal, no signs of osteomyelitis. Instructed to followup either here in the fast track or with his regular doctor for recheck.  Dagmar Hait, MD 01/12/13 2133

## 2014-12-10 ENCOUNTER — Encounter (HOSPITAL_COMMUNITY): Payer: Self-pay | Admitting: Emergency Medicine

## 2014-12-10 ENCOUNTER — Inpatient Hospital Stay (HOSPITAL_COMMUNITY)
Admission: EM | Admit: 2014-12-10 | Discharge: 2014-12-14 | DRG: 603 | Disposition: A | Discharge status: Home Health | Payer: Medicaid Other | Attending: Family Medicine | Admitting: Family Medicine

## 2014-12-10 ENCOUNTER — Emergency Department (HOSPITAL_COMMUNITY): Payer: Medicaid Other

## 2014-12-10 DIAGNOSIS — Z833 Family history of diabetes mellitus: Secondary | ICD-10-CM | POA: Diagnosis not present

## 2014-12-10 DIAGNOSIS — L97429 Non-pressure chronic ulcer of left heel and midfoot with unspecified severity: Secondary | ICD-10-CM | POA: Diagnosis present

## 2014-12-10 DIAGNOSIS — F17210 Nicotine dependence, cigarettes, uncomplicated: Secondary | ICD-10-CM | POA: Diagnosis present

## 2014-12-10 DIAGNOSIS — G809 Cerebral palsy, unspecified: Secondary | ICD-10-CM | POA: Diagnosis present

## 2014-12-10 DIAGNOSIS — E7800 Pure hypercholesterolemia, unspecified: Secondary | ICD-10-CM | POA: Insufficient documentation

## 2014-12-10 DIAGNOSIS — I10 Essential (primary) hypertension: Secondary | ICD-10-CM | POA: Diagnosis present

## 2014-12-10 DIAGNOSIS — L03115 Cellulitis of right lower limb: Secondary | ICD-10-CM | POA: Diagnosis present

## 2014-12-10 DIAGNOSIS — Z8249 Family history of ischemic heart disease and other diseases of the circulatory system: Secondary | ICD-10-CM

## 2014-12-10 DIAGNOSIS — L03116 Cellulitis of left lower limb: Secondary | ICD-10-CM | POA: Diagnosis present

## 2014-12-10 DIAGNOSIS — L0390 Cellulitis, unspecified: Secondary | ICD-10-CM | POA: Diagnosis present

## 2014-12-10 DIAGNOSIS — E1165 Type 2 diabetes mellitus with hyperglycemia: Secondary | ICD-10-CM | POA: Diagnosis present

## 2014-12-10 DIAGNOSIS — M79662 Pain in left lower leg: Secondary | ICD-10-CM | POA: Diagnosis present

## 2014-12-10 DIAGNOSIS — L03119 Cellulitis of unspecified part of limb: Secondary | ICD-10-CM

## 2014-12-10 HISTORY — DX: Cerebral palsy, unspecified: G80.9

## 2014-12-10 LAB — CBC WITH DIFFERENTIAL/PLATELET
Basophils Absolute: 0 10*3/uL (ref 0.0–0.1)
Basophils Relative: 0 % (ref 0–1)
EOS ABS: 0.1 10*3/uL (ref 0.0–0.7)
EOS PCT: 1 % (ref 0–5)
HCT: 43.9 % (ref 39.0–52.0)
HEMOGLOBIN: 14.6 g/dL (ref 13.0–17.0)
LYMPHS PCT: 25 % (ref 12–46)
Lymphs Abs: 3.3 10*3/uL (ref 0.7–4.0)
MCH: 29.7 pg (ref 26.0–34.0)
MCHC: 33.3 g/dL (ref 30.0–36.0)
MCV: 89.2 fL (ref 78.0–100.0)
MONO ABS: 1 10*3/uL (ref 0.1–1.0)
MONOS PCT: 8 % (ref 3–12)
NEUTROS ABS: 8.5 10*3/uL — AB (ref 1.7–7.7)
NEUTROS PCT: 66 % (ref 43–77)
Platelets: 249 10*3/uL (ref 150–400)
RBC: 4.92 MIL/uL (ref 4.22–5.81)
RDW: 13.9 % (ref 11.5–15.5)
WBC: 13 10*3/uL — AB (ref 4.0–10.5)

## 2014-12-10 LAB — BASIC METABOLIC PANEL
Anion gap: 9 (ref 5–15)
BUN: 11 mg/dL (ref 6–20)
CO2: 24 mmol/L (ref 22–32)
CREATININE: 0.76 mg/dL (ref 0.61–1.24)
Calcium: 8.9 mg/dL (ref 8.9–10.3)
Chloride: 107 mmol/L (ref 101–111)
Glucose, Bld: 138 mg/dL — ABNORMAL HIGH (ref 65–99)
POTASSIUM: 3.8 mmol/L (ref 3.5–5.1)
Sodium: 140 mmol/L (ref 135–145)

## 2014-12-10 LAB — LACTIC ACID, PLASMA: LACTIC ACID, VENOUS: 1.4 mmol/L (ref 0.5–2.0)

## 2014-12-10 MED ORDER — ENOXAPARIN SODIUM 40 MG/0.4ML ~~LOC~~ SOLN
40.0000 mg | SUBCUTANEOUS | Status: DC
  Administered 2014-12-10 – 2014-12-12 (×3): 40 mg via SUBCUTANEOUS
  Filled 2014-12-10 (×4): qty 0.4

## 2014-12-10 MED ORDER — ACETAMINOPHEN 325 MG PO TABS: 650.0000 mg | ORAL_TABLET | Freq: Four times a day (QID) | ORAL | Status: DC | PRN

## 2014-12-10 MED ORDER — PIPERACILLIN-TAZOBACTAM 3.375 G IVPB
3.3750 g | Freq: Three times a day (TID) | INTRAVENOUS | Status: DC
  Administered 2014-12-10 – 2014-12-14 (×11): 3.375 g via INTRAVENOUS
  Filled 2014-12-10 (×14): qty 50

## 2014-12-10 MED ORDER — TRIAMCINOLONE ACETONIDE 0.1 % EX CREA
TOPICAL_CREAM | CUTANEOUS | Status: AC
  Filled 2014-12-10: qty 15

## 2014-12-10 MED ORDER — SODIUM CHLORIDE 0.9 % IV SOLN
1000.0000 mL | Freq: Once | INTRAVENOUS | Status: AC
Start: 2014-12-10 — End: 2014-12-10
  Administered 2014-12-10: 1000 mL via INTRAVENOUS

## 2014-12-10 MED ORDER — OXYCODONE-ACETAMINOPHEN 5-325 MG PO TABS
2.0000 | ORAL_TABLET | ORAL | Status: DC | PRN
  Administered 2014-12-10 – 2014-12-14 (×13): 2 via ORAL
  Filled 2014-12-10 (×16): qty 2

## 2014-12-10 MED ORDER — ZOLPIDEM TARTRATE 5 MG PO TABS
5.0000 mg | ORAL_TABLET | Freq: Every evening | ORAL | Status: DC | PRN
  Administered 2014-12-14: 5 mg via ORAL
  Filled 2014-12-10: qty 1

## 2014-12-10 MED ORDER — PIPERACILLIN-TAZOBACTAM 3.375 G IVPB
INTRAVENOUS | Status: AC
  Filled 2014-12-10: qty 50

## 2014-12-10 MED ORDER — FENOFIBRATE 160 MG PO TABS
160.0000 mg | ORAL_TABLET | Freq: Every day | ORAL | Status: DC
  Administered 2014-12-10 – 2014-12-14 (×5): 160 mg via ORAL
  Filled 2014-12-10 (×5): qty 1

## 2014-12-10 MED ORDER — PIPERACILLIN-TAZOBACTAM 3.375 G IVPB
3.3750 g | Freq: Once | INTRAVENOUS | Status: AC
  Administered 2014-12-10: 3.375 g via INTRAVENOUS
  Filled 2014-12-10: qty 50

## 2014-12-10 MED ORDER — VANCOMYCIN HCL IN DEXTROSE 1-5 GM/200ML-% IV SOLN
1000.0000 mg | Freq: Three times a day (TID) | INTRAVENOUS | Status: DC
  Filled 2014-12-10 (×8): qty 200

## 2014-12-10 MED ORDER — SODIUM CHLORIDE 0.9 % IV BOLUS (SEPSIS)
1000.0000 mL | Freq: Once | INTRAVENOUS | Status: AC
  Administered 2014-12-10: 1000 mL via INTRAVENOUS

## 2014-12-10 MED ORDER — LISINOPRIL 10 MG PO TABS
40.0000 mg | ORAL_TABLET | Freq: Every day | ORAL | Status: DC
  Administered 2014-12-11 – 2014-12-14 (×4): 40 mg via ORAL
  Filled 2014-12-10 (×4): qty 4

## 2014-12-10 MED ORDER — ACETAMINOPHEN 650 MG RE SUPP
650.0000 mg | Freq: Four times a day (QID) | RECTAL | Status: DC | PRN
Start: 2014-12-10 — End: 2014-12-14

## 2014-12-10 MED ORDER — VANCOMYCIN HCL IN DEXTROSE 1-5 GM/200ML-% IV SOLN
1000.0000 mg | Freq: Three times a day (TID) | INTRAVENOUS | Status: DC
  Administered 2014-12-10 – 2014-12-14 (×10): 1000 mg via INTRAVENOUS
  Filled 2014-12-10 (×15): qty 200

## 2014-12-10 MED ORDER — TRIAMCINOLONE ACETONIDE 0.1 % EX CREA
1.0000 "application " | TOPICAL_CREAM | Freq: Two times a day (BID) | CUTANEOUS | Status: DC
  Administered 2014-12-10 – 2014-12-14 (×7): 1 via TOPICAL
  Filled 2014-12-10: qty 15

## 2014-12-10 MED ORDER — NICOTINE 21 MG/24HR TD PT24
21.0000 mg | MEDICATED_PATCH | Freq: Every day | TRANSDERMAL | Status: DC
  Administered 2014-12-10 – 2014-12-14 (×5): 21 mg via TRANSDERMAL
  Filled 2014-12-10 (×5): qty 1

## 2014-12-10 MED ORDER — SODIUM CHLORIDE 0.9 % IV SOLN
1000.0000 mL | INTRAVENOUS | Status: DC
  Administered 2014-12-10 – 2014-12-13 (×5): 1000 mL via INTRAVENOUS

## 2014-12-10 MED ORDER — ONDANSETRON HCL 4 MG/2ML IJ SOLN: 4.0000 mg | Freq: Four times a day (QID) | INTRAMUSCULAR | Status: DC | PRN

## 2014-12-10 MED ORDER — VANCOMYCIN HCL IN DEXTROSE 1-5 GM/200ML-% IV SOLN
1000.0000 mg | Freq: Once | INTRAVENOUS | Status: AC
  Administered 2014-12-10: 1000 mg via INTRAVENOUS
  Filled 2014-12-10: qty 200

## 2014-12-10 MED ORDER — VANCOMYCIN HCL IN DEXTROSE 1-5 GM/200ML-% IV SOLN
INTRAVENOUS | Status: AC
  Filled 2014-12-10: qty 400

## 2014-12-10 MED ORDER — OXYCODONE-ACETAMINOPHEN 5-325 MG PO TABS
2.0000 | ORAL_TABLET | Freq: Once | ORAL | Status: AC
  Administered 2014-12-10: 2 via ORAL

## 2014-12-10 MED ORDER — ALUM & MAG HYDROXIDE-SIMETH 200-200-20 MG/5ML PO SUSP: 30.0000 mL | Freq: Four times a day (QID) | ORAL | Status: DC | PRN

## 2014-12-10 MED ORDER — ONDANSETRON HCL 4 MG PO TABS: 4.0000 mg | ORAL_TABLET | Freq: Four times a day (QID) | ORAL | Status: DC | PRN

## 2014-12-10 NOTE — Progress Notes (Signed)
ANTIBIOTIC CONSULT NOTE - INITIAL  Pharmacy Consult for Vancomycin & Zosyn Indication: cellulitis  Allergies  Allergen Reactions  . Pravastatin Itching and Rash    Patient Measurements: Height: 5\' 3"  (160 cm) Weight: 219 lb 4.8 oz (99.474 kg) IBW/kg (Calculated) : 56.9 Adjusted Body Weight:   Vital Signs: Temp: 97.4 F (36.3 C) (07/08 1803) Temp Source: Oral (07/08 1803) BP: 136/83 mmHg (07/08 1803) Pulse Rate: 83 (07/08 1803) Intake/Output from previous day:   Intake/Output from this shift:    Labs:  Recent Labs  12/10/14 1522  WBC 13.0*  HGB 14.6  PLT 249  CREATININE 0.76   Estimated Creatinine Clearance: 127 mL/min (by C-G formula based on Cr of 0.76). No results for input(s): VANCOTROUGH, VANCOPEAK, VANCORANDOM, GENTTROUGH, GENTPEAK, GENTRANDOM, TOBRATROUGH, TOBRAPEAK, TOBRARND, AMIKACINPEAK, AMIKACINTROU, AMIKACIN in the last 72 hours.   Microbiology: Recent Results (from the past 720 hour(s))  Culture, blood (single)     Status: None (Preliminary result)   Collection Time: 12/10/14  3:22 PM  Result Value Ref Range Status   Specimen Description BLOOD LEFT HAND  Final   Special Requests BOTTLES DRAWN AEROBIC AND ANAEROBIC 6CC  Final   Culture PENDING  Incomplete   Report Status PENDING  Incomplete  Culture, blood (routine x 2)     Status: None (Preliminary result)   Collection Time: 12/10/14  3:41 PM  Result Value Ref Range Status   Specimen Description LEFT ANTECUBITAL  Final   Special Requests BOTTLES DRAWN AEROBIC AND ANAEROBIC 6CC  Final   Culture PENDING  Incomplete   Report Status PENDING  Incomplete    Medical History: Past Medical History  Diagnosis Date  . Hypertension   . High cholesterol   . Premature birth   . Arthritis   . Cerebral palsy     Medications:  Prescriptions prior to admission  Medication Sig Dispense Refill Last Dose  . Choline Fenofibrate (TRILIPIX) 135 MG capsule Take 135 mg by mouth at bedtime.    12/09/2014 at  Unknown time  . lisinopril (PRINIVIL,ZESTRIL) 40 MG tablet Take 40 mg by mouth daily.     12/10/2014 at Unknown time  . triamcinolone cream (KENALOG) 0.1 % Apply 1 application topically 2 (two) times daily. APPLIED TO LEFT FOOT   Past Week at Unknown time  . acetaminophen (TYLENOL) 500 MG tablet Take 500 mg by mouth every 6 (six) hours as needed for pain.   unknown   Assessment: 39 yo male. Presents with 2 weeks of worsening bilateral lower extremity redness, pain, sores. Vancomycin 1 GM IV and Zosyn 3.375 GM IV given in ED  Goal of Therapy:  Vancomycin trough level 10-15 mcg/ml  Plan:  Vancomycin 1 GM IV every 8 hours Zosyn 3.375 GM IV every 8 hours Vancomycin trough at steady state Monitor renal function Labs per protocol  Raquel JamesPittman, Etan Vasudevan Bennett 12/10/2014,7:10 PM

## 2014-12-10 NOTE — ED Notes (Signed)
Pt with open draining wounds to bilat LE. Legs are reddened and edematous.

## 2014-12-10 NOTE — Consult Note (Addendum)
WOC wound consult note Reason for Consult: chronic left medial heel ulcer, cellulitis with bilateral erythema and open areas x 2 weeks, some ulcers with purulence, weeping Wound type:Infectious, chronic ulcer of unknown etiology, suspected pressure on left medial heel, full thickness Stage 3 pressure injury. Pressure Ulcer POA: Yes Measurement: left medial heel:  4cm round area of erythema with black pinpoint areas at periphery. In the center of this area is a 2cm x 1cm x 0.2cm full thickness ulcer with pink and yellow slough covered base.   Wound ZOX:WRUEAVWUJbed:Bilateral LEs with erythema, full thickness ulcers on the anterior aspect of the LEs only, draining serous fluid.  Two ulcer have purulent material beneath skin, the largest is on the left lateral LE, anterior aspect with dried serum (scab). Drainage (amount, consistency, odor) As described above Periwound:As described above. Dressing procedure/placement/frequency: Cleanse bilateral anterior LEs and left medial heel with NS, pat gently dry.  Cover anterior LEs and left medial heel with saline dampened gauze 4x4s, top anterior LEs with ABDs and left medial heel with dry gauze 4x4s, wrap with kerlix roll gauze from toe to knee and secure with tape.  Change twice daily.  Place feet into Pevalon boots and elevate while in bed. It is noted that patient is on systemic antibiotics.  Patient has been seen in the outpatient wound care center in [place] in the past.  Suggest returning to this clinic for follow-up following discharge.  If you agree, please order/arrange. WOC nursing team will not follow, but will remain available to this patient, the nursing and medical teams.  Please re-consult if needed. Thanks, Ladona MowLaurie Parker Sawatzky, MSN, RN, GNP, [place], CWON-AP [phone])

## 2014-12-10 NOTE — ED Provider Notes (Signed)
CSN: [id number]     Arrival date & time 12/10/14  1304 History   First MD Initiated Contact with Patient 12/10/14 1326     Chief Complaint  Patient presents with  . Wound Infection     (Consider location/radiation/quality/duration/timing/severity/associated sxs/prior Treatment) HPI 39 y.o. Male complaining of blister like wounds to bilateral lower legs began two weeks ago, burning in nature, no migration,  Wounds now weeping.  Patient denies prior similar symptoms.  Patient states drains at night Denies fever, chills.  Patient did not call his primary care doctor.  Patient states it has been draining the entire time.  Legs red and indurated.  No injury, no sick contacts.  Patient not employed.   Past Medical History  Diagnosis Date  . Hypertension   . High cholesterol   . Premature birth   . Arthritis   . Cerebral palsy    Past Surgical History  Procedure Laterality Date  . Appendectomy    . Heel spur surgery     Family History  Problem Relation Age of Onset  . Diabetes Other   . Hypertension Other   . Rheum arthritis Other    History  Substance Use Topics  . Smoking status: Current Every Day Smoker -- 1.00 packs/day    Types: Cigarettes  . Smokeless tobacco: Never Used  . Alcohol Use: No    Review of Systems    Allergies  Pravastatin  Home Medications   Prior to Admission medications   Medication Sig Start Date End Date Taking? Authorizing Provider  Choline Fenofibrate (TRILIPIX) 135 MG capsule Take 135 mg by mouth at bedtime.    Yes Historical Provider, MD  lisinopril (PRINIVIL,ZESTRIL) 40 MG tablet Take 40 mg by mouth daily.     Yes Historical Provider, MD  triamcinolone cream (KENALOG) 0.1 % Apply 1 application topically 2 (two) times daily. APPLIED TO LEFT FOOT   Yes Historical Provider, MD  acetaminophen (TYLENOL) 500 MG tablet Take 500 mg by mouth every 6 (six) hours as needed for pain.    Historical Provider, MD  clindamycin (CLEOCIN) 150 MG capsule Take 3  capsules (450 mg total) by mouth 3 (three) times daily. Patient not taking: Reported on 12/10/2014 01/12/13   Elwin Mocha, MD   BP 146/94 mmHg  Pulse 87  Temp(Src) 99.2 F (37.3 C) (Oral)  Resp 16  Ht  (1.6 m)  Wt 200 lb (90.719 kg)  BMI 35.44 kg/m2  SpO2 96% Physical Exam  Constitutional: He appears well-developed and well-nourished.  HENT:  Head: Normocephalic and atraumatic.  Eyes: Conjunctivae are normal. Pupils are equal, round, and reactive to light.  Neck: Normal range of motion. Neck supple.  Cardiovascular: Normal rate, regular rhythm, normal heart sounds and intact distal pulses.   Pulmonary/Chest: Effort normal.  Abdominal: Bowel sounds are normal.  Musculoskeletal: Normal range of motion. He exhibits no edema.       Legs: Rash, ertyhema, denuded skin circular area with weeping.   Nursing note and vitals reviewed.   ED Course  Procedures (including critical care time) Labs Review Labs Reviewed  CBC WITH DIFFERENTIAL/PLATELET - Abnormal; Notable for the following:    WBC 13.0 (*)    Neutro Abs 8.5 (*)    All other components within normal limits  CULTURE, BLOOD (ROUTINE X 2)  CULTURE, BLOOD (SINGLE)  BASIC METABOLIC PANEL  LACTIC ACID, PLASMA    Imaging Review Dg Chest 1 View  12/10/2014   CLINICAL DATA:  Lower extremity redness and  swelling.  Hypertension.  EXAM: CHEST  1 VIEW  COMPARISON:  June 12, 2010  FINDINGS: There is no edema or consolidation. Heart is upper normal in size with pulmonary vascularity within normal limits. No adenopathy. No bone lesions.  IMPRESSION: No edema or consolidation.   Electronically Signed   By: Bretta BangWilliam  Woodruff III M.D.   On: 12/10/2014 15:38     EKG Interpretation   Date/Time:  Friday December 10 2014 15:49:28 EDT Ventricular Rate:  85 PR Interval:  164 QRS Duration: 86 QT Interval:  443 QTC Calculation: 527 R Axis:   76 Text Interpretation:  Normal sinus rhythm Confirmed by Kadeen Sroka MD, Duwayne HeckANIELLE  ([id number]) on 12/10/2014  4:08:27 PM      MDM   Final diagnoses:  Cellulitis of lower extremity, unspecified laterality   39 year old male who presents today with bilateral lower extremity cellulitis. Symptoms have been worsening over 2 weeks. He has had some nausea and increased weakness. Blood cell count is elevated at 13,000. His received IV antibody Zosyn and vancomycin here in the emergency department.  Admission for further treatment.   Margarita Grizzleanielle Embrie Mikkelsen, MD 12/10/14 [phone]

## 2014-12-10 NOTE — H&P (Addendum)
History and Physical  Bruce Love HKV:[id number] DOB: [birth year] DOA: 12/10/2014  Referring physician: Dr Rosalia Hammersay, ED physician PCP: [name],[name], MD   Chief Complaint: Leg pain and redness  HPI: Bruce CrookLloyd E Hord is a 39 y.o. male  With a history of cerebral palsy, hypertension, hypercholesterolemia. Patient lives on his own. The patient presents with 2 weeks of worsening bilateral lower extremity redness, pain, sores. He has attempted to clean the sores on his own, but this has not helped. It has worsened over time. No provoking factors. He does have a significant ulcer on his left heel which she states has been fairly chronic over the past 2 years.    Review of Systems:   Pt complains of pain.  Pt denies any fevers, chills, nausea, vomiting, abdominal pain, chest pain, shortness of breath, difficulty breathing, decreased appetite.  Review of systems are otherwise negative  Past Medical History  Diagnosis Date  . Hypertension   . High cholesterol   . Premature birth   . Arthritis   . Cerebral palsy    Past Surgical History  Procedure Laterality Date  . Appendectomy    . Heel spur surgery     Social History:  reports that he has been smoking Cigarettes.  He has been smoking about 1.00 pack per day. He has never used smokeless tobacco. He reports that he does not drink alcohol or use illicit drugs. Patient lives at home & is able to participate in activities of daily living  Allergies  Allergen Reactions  . Pravastatin Itching and Rash    Family History  Problem Relation Age of Onset  . Diabetes Other   . Hypertension Other   . Rheum arthritis Other       Prior to Admission medications   Medication Sig Start Date End Date Taking? Authorizing Provider  Choline Fenofibrate (TRILIPIX) 135 MG capsule Take 135 mg by mouth at bedtime.    Yes Historical Provider, MD  lisinopril (PRINIVIL,ZESTRIL) 40 MG tablet Take 40 mg by mouth daily.     Yes Historical Provider,  MD  triamcinolone cream (KENALOG) 0.1 % Apply 1 application topically 2 (two) times daily. APPLIED TO LEFT FOOT   Yes Historical Provider, MD  acetaminophen (TYLENOL) 500 MG tablet Take 500 mg by mouth every 6 (six) hours as needed for pain.    Historical Provider, MD    Physical Exam: BP 110/91 mmHg  Pulse 84  Temp(Src) 99.2 F (37.3 C) (Oral)  Resp 23  Ht 5\' 3"  (1.6 m)  Wt 90.719 kg (200 lb)  BMI 35.44 kg/m2  SpO2 97%  General: Young Caucasian male. Awake and alert and oriented x3. No acute cardiopulmonary distress.  Eyes: Pupils equal, round, reactive to light. Extraocular muscles are intact. Sclerae anicteric and noninjected.  ENT:  Moist mucosal membranes. No mucosal lesions. Teeth in moderate repair  Neck: Neck supple without lymphadenopathy. No carotid bruits. No masses palpated.  Cardiovascular: Regular rate with normal S1-S2 sounds. No murmurs, rubs, gallops auscultated. No JVD.  Respiratory: Good respiratory effort with no wheezes, rales, rhonchi. Lungs clear to auscultation bilaterally.  Abdomen: Soft, nontender, nondistended. Active bowel sounds. No masses or hepatosplenomegaly  Skin: Dry, warm to touch. 2+ dorsalis pedis and radial pulses. There is significant erythema to the lower extremities bilaterally to the mid shin and distal to the ankle. There are multiple erupted blisters that appear wet, but without frank discharge. The legs are warm to touch. He does have a 2 cm ulceration  of his left medial heel that is quite tender to touch. Musculoskeletal: No calf or leg pain. All major joints not erythematous nontender.  Psychiatric: Intact judgment and insight.  Neurologic: No focal neurological deficits. Cranial nerves II through XII are grossly intact.           Labs on Admission:  Basic Metabolic Panel:  Recent Labs Lab 12/10/14 1522  NA 140  K 3.8  CL 107  CO2 24  GLUCOSE 138*  BUN 11  CREATININE 0.76  CALCIUM 8.9   Liver Function Tests: No results for  input(s): AST, ALT, ALKPHOS, BILITOT, PROT, ALBUMIN in the last 168 hours. No results for input(s): LIPASE, AMYLASE in the last 168 hours. No results for input(s): AMMONIA in the last 168 hours. CBC:  Recent Labs Lab 12/10/14 1522  WBC 13.0*  NEUTROABS 8.5*  HGB 14.6  HCT 43.9  MCV 89.2  PLT 249   Cardiac Enzymes: No results for input(s): CKTOTAL, CKMB, CKMBINDEX, TROPONINI in the last 168 hours.  BNP (last 3 results) No results for input(s): BNP in the last 8760 hours.  ProBNP (last 3 results) No results for input(s): PROBNP in the last 8760 hours.  CBG: No results for input(s): GLUCAP in the last 168 hours.  Radiological Exams on Admission: Dg Chest 1 View  12/10/2014   CLINICAL DATA:  Lower extremity redness and swelling.  Hypertension.  EXAM: CHEST  1 VIEW  COMPARISON:  June 12, 2010  FINDINGS: There is no edema or consolidation. Heart is upper normal in size with pulmonary vascularity within normal limits. No adenopathy. No bone lesions.  IMPRESSION: No edema or consolidation.   Electronically Signed   By: Bretta Bang III M.D.   On: 12/10/2014 15:38    EKG: Independently reviewed. Normal sinus rhythm. No ST changes. Normal intervals  Assessment/Plan Present on Admission:  . Cellulitis  This patient was discussed with the ED physician, including pertinent vitals, physical exam findings, labs, and imaging.  We also discussed care given by the ED provider.  #1 cellulitis Admit Due to severity of disease, we'll continue the Zosyn and vancomycin Cultures obtained of the left heel wound Cellulitis demarcated Obtain HIV and hemoglobin A1c to rule out diabetes Repeat CBC in the morning Wound consult obtained Oxycodone for pain  DVT prophylaxis: Lovenox  Consultants: None  Code Status: Full code  Family Communication: None   Disposition Plan: Home follow improvement  Levie Heritage, DO [facility] Pager [phone]

## 2014-12-11 LAB — BASIC METABOLIC PANEL
Anion gap: 7 (ref 5–15)
BUN: 8 mg/dL (ref 6–20)
CHLORIDE: 109 mmol/L (ref 101–111)
CO2: 24 mmol/L (ref 22–32)
Calcium: 7.9 mg/dL — ABNORMAL LOW (ref 8.9–10.3)
Creatinine, Ser: 0.73 mg/dL (ref 0.61–1.24)
Glucose, Bld: 168 mg/dL — ABNORMAL HIGH (ref 65–99)
POTASSIUM: 3.6 mmol/L (ref 3.5–5.1)
SODIUM: 140 mmol/L (ref 135–145)

## 2014-12-11 NOTE — Progress Notes (Signed)
Bruce CrookLloyd E Kuss UJW:[id number] DOB: 11/26/1975 DOA: 12/10/2014 PCP: [name],[name], MD   Subjective: This man was admitted yesterday with bilateral cellulitis. He is also been feeling feverish. He has been started on broad-spectrum antibiotics. He has remained stable overnight.           Physical Exam: Blood pressure 124/82, pulse 75, temperature 98 F (36.7 C), temperature source Oral, resp. rate 19, height 5\' 3"  (1.6 m), weight 99.474 kg (219 lb 4.8 oz), SpO2 96 %. He looks systemically well. He is not toxic or septic. Both his legs are wrapped around at the present time with simple gauze type dressing. He is alert and oriented.   Investigations:  Recent Results (from the past 240 hour(s))  Culture, blood (single)     Status: None (Preliminary result)   Collection Time: 12/10/14  3:22 PM  Result Value Ref Range Status   Specimen Description BLOOD LEFT HAND  Final   Special Requests BOTTLES DRAWN AEROBIC AND ANAEROBIC 6CC  Final   Culture NO GROWTH < 24 HOURS  Final   Report Status PENDING  Incomplete  Culture, blood (routine x 2)     Status: None (Preliminary result)   Collection Time: 12/10/14  3:41 PM  Result Value Ref Range Status   Specimen Description LEFT ANTECUBITAL  Final   Special Requests BOTTLES DRAWN AEROBIC AND ANAEROBIC 6CC  Final   Culture NO GROWTH < 24 HOURS  Final   Report Status PENDING  Incomplete  Wound culture     Status: None (Preliminary result)   Collection Time: 12/10/14  5:40 PM  Result Value Ref Range Status   Specimen Description HEEL  Final   Special Requests Normal  Final   Gram Stain   Final    NO WBC SEEN NO SQUAMOUS EPITHELIAL CELLS SEEN RARE GRAM POSITIVE RODS RARE GRAM NEGATIVE RODS [facility]formed at [facility]    Culture NO GROWTH Performed at [facility]   Final   Report Status PENDING  Incomplete     Basic Metabolic Panel:  Recent Labs  56/21/3006/01/17 1522 12/11/14 0601  NA 140 140  K 3.8 3.6  CL 107  109  CO2 24 24  GLUCOSE 138* 168*  BUN 11 8  CREATININE 0.76 0.73  CALCIUM 8.9 7.9*   Liver Function Tests: No results for input(s): AST, ALT, ALKPHOS, BILITOT, PROT, ALBUMIN in the last 72 hours.   CBC:  Recent Labs  12/10/14 1522  WBC 13.0*  NEUTROABS 8.5*  HGB 14.6  HCT 43.9  MCV 89.2  PLT 249    Dg Chest 1 View  12/10/2014   CLINICAL DATA:  Lower extremity redness and swelling.  Hypertension.  EXAM: CHEST  1 VIEW  COMPARISON:  June 12, 2010  FINDINGS: There is no edema or consolidation. Heart is upper normal in size with pulmonary vascularity within normal limits. No adenopathy. No bone lesions.  IMPRESSION: No edema or consolidation.   Electronically Signed   By: Bretta BangWilliam  Woodruff III M.D.   On: 12/10/2014 15:38      Medications: I have reviewed the patient's current medications.  Impression: 1. Bilateral leg cellulitis. 2. Cerebral palsy. Stable. 3. Hypertension. Stable. 4. Hyperglycemia.     Plan: 1. Continue with intravenous antibiotics. 2. Check hemoglobin A1c result.  Consultants:  None.   Procedures:  None.   Antibiotics:  Intravenous vancomycin and Zosyn started yesterday.  Code Status: Full code.  Family Communication: I discussed the plan with the patient at the bedside.   Disposition Plan: Home in medically stable.  Time spent: 15 minutes.   LOS: 1 day   Zhanae Proffit C   12/11/2014, 9:44 AM

## 2014-12-11 NOTE — Progress Notes (Signed)
Removed dressings from bilateral lower extremities.  Cleansed both legs and heels with NS, placed wet to dry guaze on left heel, ABD pads on both lower legs and wrapped with Kerlix.  Pt tolerated well.  Will continue to monitor.

## 2014-12-12 LAB — CBC
HEMATOCRIT: 41 % (ref 39.0–52.0)
Hemoglobin: 13.5 g/dL (ref 13.0–17.0)
MCH: 29.2 pg (ref 26.0–34.0)
MCHC: 32.9 g/dL (ref 30.0–36.0)
MCV: 88.6 fL (ref 78.0–100.0)
PLATELETS: 198 10*3/uL (ref 150–400)
RBC: 4.63 MIL/uL (ref 4.22–5.81)
RDW: 13.5 % (ref 11.5–15.5)
WBC: 8.4 10*3/uL (ref 4.0–10.5)

## 2014-12-12 LAB — COMPREHENSIVE METABOLIC PANEL
ALBUMIN: 3.4 g/dL — AB (ref 3.5–5.0)
ALK PHOS: 40 U/L (ref 38–126)
ALT: 23 U/L (ref 17–63)
ANION GAP: 7 (ref 5–15)
AST: 19 U/L (ref 15–41)
BILIRUBIN TOTAL: 0.5 mg/dL (ref 0.3–1.2)
BUN: 6 mg/dL (ref 6–20)
CHLORIDE: 108 mmol/L (ref 101–111)
CO2: 25 mmol/L (ref 22–32)
CREATININE: 0.76 mg/dL (ref 0.61–1.24)
Calcium: 8.3 mg/dL — ABNORMAL LOW (ref 8.9–10.3)
Glucose, Bld: 99 mg/dL (ref 65–99)
Potassium: 3.8 mmol/L (ref 3.5–5.1)
SODIUM: 140 mmol/L (ref 135–145)
Total Protein: 6.7 g/dL (ref 6.5–8.1)

## 2014-12-12 LAB — HIV ANTIBODY (ROUTINE TESTING W REFLEX): HIV Screen 4th Generation wRfx: NONREACTIVE

## 2014-12-12 NOTE — Progress Notes (Signed)
Bruce Love [id number] DOB: 06-Feb-1976 DOA: 12/10/2014 PCP: Bruce StallingNDIEGO,[name], Love   Subjective: This man was admitted with bilateral cellulitis and several wounds. He has been seen by wound care and they have made recommendations which the nursing staff are following. He has not had a fever in the hospital. He continues on broad-spectrum intravenous antibiotics.           Physical Exam: Blood pressure 133/82, pulse 76, temperature 98 F (36.7 Love), temperature source Oral, resp. rate 20, height 5\' 3"  (1.6 m), weight 99.474 kg (219 lb 4.8 oz), SpO2 98 %. He looks systemically well. He is not toxic or septic. I asked the nurse to unwrap the dressing this morning and was able to see his legs. There are lines of demarcation by ink and the cellulitis has not progressed beyond this. There are several areas on both his legs more and the right lower leg than the left that have wounds.   Investigations:  Recent Results (from the past 240 hour(s))  Culture, blood (single)     Status: None (Preliminary result)   Collection Time: 12/10/14  3:22 PM  Result Value Ref Range Status   Specimen Description BLOOD LEFT HAND  Final   Special Requests BOTTLES DRAWN AEROBIC AND ANAEROBIC 6CC  Final   Culture NO GROWTH < 24 HOURS  Final   Report Status PENDING  Incomplete  Culture, blood (routine x 2)     Status: None (Preliminary result)   Collection Time: 12/10/14  3:41 PM  Result Value Ref Range Status   Specimen Description LEFT ANTECUBITAL  Final   Special Requests BOTTLES DRAWN AEROBIC AND ANAEROBIC 6CC  Final   Culture NO GROWTH < 24 HOURS  Final   Report Status PENDING  Incomplete  Wound culture     Status: None (Preliminary result)   Collection Time: 12/10/14  5:40 PM  Result Value Ref Range Status   Specimen Description HEEL  Final   Special Requests Normal  Final   Gram Stain   Final    NO WBC SEEN NO SQUAMOUS EPITHELIAL CELLS SEEN RARE GRAM POSITIVE RODS RARE GRAM NEGATIVE  RODS Performed at [facility]    Culture NO GROWTH Performed at [facility]   Final   Report Status PENDING  Incomplete     Basic Metabolic Panel:  Recent Labs  44/06/205/09/16 0601 12/12/14 0549  NA 140 140  K 3.6 3.8  CL 109 108  CO2 24 25  GLUCOSE 168* 99  BUN 8 6  CREATININE 0.73 0.76  CALCIUM 7.9* 8.3*   Liver Function Tests:  Recent Labs  12/12/14 0549  AST 19  ALT 23  ALKPHOS 40  BILITOT 0.5  PROT 6.7  ALBUMIN 3.4*     CBC:  Recent Labs  12/10/14 1522 12/12/14 0549  WBC 13.0* 8.4  NEUTROABS 8.5*  --   HGB 14.6 13.5  HCT 43.9 41.0  MCV 89.2 88.6  PLT 249 198    Dg Chest 1 View  12/10/2014   CLINICAL DATA:  Lower extremity redness and swelling.  Hypertension.  EXAM: CHEST  1 VIEW  COMPARISON:  June 12, 2010  FINDINGS: There is no edema or consolidation. Heart is upper normal in size with pulmonary vascularity within normal limits. No adenopathy. No bone lesions.  IMPRESSION: No edema or consolidation.   Electronically Signed   By: Bruce Love M.D.   On: 12/10/2014 15:38      Medications: I have reviewed  the patient's current medications.  Impression: 1. Bilateral leg cellulitis. 2. Cerebral palsy. Stable. 3. Hypertension. Stable. 4. Hyperglycemia.     Plan: 1. Continue with intravenous antibiotics. Continue with wound care. 2. Check hemoglobin A1c result.  Consultants:  None.   Procedures:  None.   Antibiotics:  Intravenous vancomycin and Zosyn .Marland Kitchen                   Code Status: Full code.  Family Communication: I discussed the plan with the patient at the bedside.   Disposition Plan: Home with advanced home care when medically stable.  Time spent: 15 minutes.   LOS: 2 days   Bruce Love   12/12/2014, 8:03 AM

## 2014-12-12 NOTE — Progress Notes (Signed)
Removed dressings from bilateral lower extremities for Dr Karilyn CotaGosrani to assess.     Cleansed both legs and heels with NS, placed wet to dry guaze on left heel, ABD pads on both lower legs and wrapped with Kerlix. Pt tolerated well. Will continue to monitor.

## 2014-12-13 ENCOUNTER — Encounter (HOSPITAL_COMMUNITY): Payer: Medicaid Other

## 2014-12-13 LAB — BASIC METABOLIC PANEL
Anion gap: 6 (ref 5–15)
BUN: 7 mg/dL (ref 6–20)
CO2: 29 mmol/L (ref 22–32)
Calcium: 8.3 mg/dL — ABNORMAL LOW (ref 8.9–10.3)
Chloride: 106 mmol/L (ref 101–111)
Creatinine, Ser: 0.85 mg/dL (ref 0.61–1.24)
GFR calc non Af Amer: >60 mL/min (ref >60)
GLUCOSE: 97 mg/dL (ref 65–99)
POTASSIUM: 3.7 mmol/L (ref 3.5–5.1)
SODIUM: 141 mmol/L (ref 135–145)

## 2014-12-13 LAB — HEMOGLOBIN A1C
Hgb A1c MFr Bld: 6.9 % — ABNORMAL HIGH (ref 4.8–5.6)
Mean Plasma Glucose: 151 mg/dL

## 2014-12-13 LAB — VANCOMYCIN, TROUGH: Vancomycin Tr: 15 ug/mL (ref 10.0–20.0)

## 2014-12-13 MED ORDER — TRIAMCINOLONE ACETONIDE 0.1 % EX OINT
TOPICAL_OINTMENT | CUTANEOUS | Status: AC
  Filled 2014-12-13: qty 15

## 2014-12-13 MED ORDER — MORPHINE SULFATE 2 MG/ML IJ SOLN
2.0000 mg | INTRAMUSCULAR | Status: DC | PRN
  Administered 2014-12-13 – 2014-12-14 (×2): 2 mg via INTRAVENOUS
  Filled 2014-12-13 (×3): qty 1

## 2014-12-13 NOTE — Progress Notes (Signed)
ANTIBIOTIC CONSULT NOTE - FOLLOW UP  Pharmacy Consult for Vancomycin and Zosyn  Indication: cellulitis   Allergies  Allergen Reactions  . Pravastatin Itching and Rash    Patient Measurements: Height:  (160 cm) Weight: 219 lb 4.8 oz (99.474 kg) IBW/kg (Calculated) : 56.9  Vital Signs: Temp: 98.1 F (36.7 C) (07/11 0647) Temp Source: Oral (07/11 0647) BP: 142/86 mmHg (07/11 0647) Pulse Rate: 66 (07/11 0647) Intake/Output from previous day: 07/10 0701 - 07/11 0700 In: 2910 [P.O.:1560; IV Piggyback:1350] Out: 2775 [Urine:2775] Intake/Output from this shift:    Labs:  Recent Labs  12/10/14 1522 12/11/14 0601 12/12/14 0549 12/13/14 0554  WBC 13.0*  --  8.4  --   HGB 14.6  --  13.5  --   PLT 249  --  198  --   CREATININE 0.76 0.73 0.76 0.85   Estimated Creatinine Clearance: 119.5 mL/min (by C-G formula based on Cr of 0.85).  Recent Labs  12/13/14 0554  [facility] 15     Microbiology: Recent Results (from the past 720 hour(s))  Culture, blood (single)     Status: None (Preliminary result)   Collection Time: 12/10/14  3:22 PM  Result Value Ref Range Status   Specimen Description BLOOD LEFT HAND  Final   Special Requests BOTTLES DRAWN AEROBIC AND ANAEROBIC 6CC  Final   Culture NO GROWTH < 24 HOURS  Final   Report Status PENDING  Incomplete  Culture, blood (routine x 2)     Status: None (Preliminary result)   Collection Time: 12/10/14  3:41 PM  Result Value Ref Range Status   Specimen Description LEFT ANTECUBITAL  Final   Special Requests BOTTLES DRAWN AEROBIC AND ANAEROBIC 6CC  Final   Culture NO GROWTH < 24 HOURS  Final   Report Status PENDING  Incomplete  Wound culture     Status: None (Preliminary result)   Collection Time: 12/10/14  5:40 PM  Result Value Ref Range Status   Specimen Description HEEL  Final   Special Requests Normal  Final   Gram Stain   Final    NO WBC SEEN NO SQUAMOUS EPITHELIAL CELLS SEEN RARE GRAM POSITIVE RODS RARE GRAM  NEGATIVE RODS Performed at [facility]    Culture   Final    MODERATE STAPHYLOCOCCUS AUREUS MODERATE GROUP B STREP(S.AGALACTIAE)ISOLATED Note: TESTING AGAINST S. AGALACTIAE NOT ROUTINELY PERFORMED DUE TO PREDICTABILITY OF AMP/PEN/VAN SUSCEPTIBILITY. Performed at [facility]    Report Status PENDING  Incomplete    Anti-infectives    Start     Dose/Rate Route Frequency Ordered Stop   12/11/14 0100  vancomycin (VANCOCIN) IVPB 1000 mg/200 mL premix  Status:  Discontinued     1,000 mg 200 mL/hr over 60 Minutes Intravenous Every 8 hours 12/10/14 1909 12/10/14 1915   12/11/14 0000  piperacillin-tazobactam (ZOSYN) IVPB 3.375 g     3.375 g 12.5 mL/hr over 240 Minutes Intravenous Every 8 hours 12/10/14 1909     12/10/14 2200  vancomycin (VANCOCIN) IVPB 1000 mg/200 mL premix     1,000 mg 200 mL/hr over 60 Minutes Intravenous Every 8 hours 12/10/14 1915     12/10/14 1530  piperacillin-tazobactam (ZOSYN) IVPB 3.375 g     3.375 g 12.5 mL/hr over 240 Minutes Intravenous  Once 12/10/14 1521 12/10/14 1956   12/10/14 1530  vancomycin (VANCOCIN) IVPB 1000 mg/200 mL premix     1,000 mg 200 mL/hr over 60 Minutes Intravenous  Once 12/10/14 1521 12/10/14 1745  Assessment: Okay for Protocol, renal function stable.  Trough @ goal.  Micro pending (staph and strep)  Goal of Therapy:  Vancomycin trough level 10-15 mcg/ml  Plan:  Continue Vancomycin and Zosyn as ordered. Measure antibiotic drug levels at steady state Follow up culture results  Bruce Love, Bruce Love 12/13/2014,12:15 PM

## 2014-12-13 NOTE — Progress Notes (Signed)
Peripherally Inserted Central Catheter/Midline Placement  The IV Nurse has discussed with the patient and/or persons authorized to consent for the patient, the purpose of this procedure and the potential benefits and risks involved with this procedure.  The benefits include less needle sticks, lab draws from the catheter and patient may be discharged home with the catheter.  Risks include, but not limited to, infection, bleeding, blood clot (thrombus formation), and puncture of an artery; nerve damage and irregular heat beat.  Alternatives to this procedure were also discussed.  PICC/Midline Placement Documentation  PICC / Midline Single Lumen 12/13/14 PICC Right Basilic 45 cm 1 cm (Active)  Indication for Insertion or Continuance of Line Home intravenous therapies (PICC only) 12/13/2014  5:00 PM  Exposed Catheter (cm) 1 cm 12/13/2014  5:00 PM  Site Assessment Clean;Dry;Intact 12/13/2014  5:00 PM  Line Status Flushed;Saline locked;Blood return noted 12/13/2014  5:00 PM  Dressing Type Transparent;Securing device 12/13/2014  5:00 PM  Dressing Status Clean;Dry;Intact;Antimicrobial disc in place 12/13/2014  5:00 PM  Line Care Connections checked and tightened 12/13/2014  5:00 PM  Dressing Intervention New dressing 12/13/2014  5:00 PM  Dressing Change Due 12/20/14 12/13/2014  5:00 PM    PICC line confirmed by ECG technology.   Kaan Tosh Renae 12/13/2014, 5:00 PM

## 2014-12-13 NOTE — Care Management Note (Signed)
Case Management Note  Patient Details  Name: Orion CrookLloyd E Hevia MRN: [id number] Date of Birth: [birth year]   Expected Discharge Date:                  Expected Discharge Plan:  Home w Home Health Services  In-House Referral:  NA  Discharge planning Services  CM Consult  Post Acute Care Choice:  Home Health Choice offered to:     DME Arranged:    DME Agency:     HH Arranged:  RN HH Agency:  [facility]  Status of Service:  In process, will continue to follow  Medicare Important Message Given:    Date Medicare IM Given:    Medicare IM give by:    Date Additional Medicare IM Given:    Additional Medicare Important Message give by:     If discussed at Long Length of Stay Meetings, dates discussed:    Additional Comments: Pt is from home, lives with wife and mother. Pt has a rollator and a Hover round. Pt has a CAP aid at home also. Pt plans to return home at DC. [facility]. CM will cont to follow.  [name], Wilda Wetherell Demske, RN 12/13/2014, 1:07 PM

## 2014-12-13 NOTE — Progress Notes (Signed)
Patient has significant bilateral lower extremity erythema cellulitis currently on thank and Zosyn will probably continue this for another 7 or 8 days has diabetes and hemoglobin A1c elevated has cerebral palsy will require RN and IV infusion team for vancomycin at home Bruce CrookLloyd E Love ZOX:[id number] DOB: 05-26-1976 DOA: 12/10/2014 PCP: Bruce StallingNDIEGO,Bruce Cantera M, MD             Physical Exam: Blood pressure 142/86, pulse 66, temperature 98.1 F (36.7 C), temperature source Oral, resp. rate 20, height 5\' 3"  (1.6 Love), weight 219 lb 4.8 oz (99.474 kg), SpO2 99 %. lungs clear to A&P no rales wheeze rhonchi heart regular rhythm no murmur because he feels rubs abdomen soft nontender bowel sounds normoactive   Investigations:  Recent Results (from the past 240 hour(s))  Culture, blood (single)     Status: None (Preliminary result)   Collection Time: 12/10/14  3:22 PM  Result Value Ref Range Status   Specimen Description BLOOD LEFT HAND  Final   Special Requests BOTTLES DRAWN AEROBIC AND ANAEROBIC 6CC  Final   Culture NO GROWTH < 24 HOURS  Final   Report Status PENDING  Incomplete  Culture, blood (routine x 2)     Status: None (Preliminary result)   Collection Time: 12/10/14  3:41 PM  Result Value Ref Range Status   Specimen Description LEFT ANTECUBITAL  Final   Special Requests BOTTLES DRAWN AEROBIC AND ANAEROBIC 6CC  Final   Culture NO GROWTH < 24 HOURS  Final   Report Status PENDING  Incomplete  Wound culture     Status: None (Preliminary result)   Collection Time: 12/10/14  5:40 PM  Result Value Ref Range Status   Specimen Description HEEL  Final   Special Requests Normal  Final   Gram Stain   Final    NO WBC SEEN NO SQUAMOUS EPITHELIAL CELLS SEEN RARE GRAM POSITIVE RODS RARE GRAM NEGATIVE RODS [facility]formed at [facility]    Culture   Final    MODERATE STAPHYLOCOCCUS AUREUS MODERATE GROUP B STREP(S.AGALACTIAE)ISOLATED Note: TESTING AGAINST S. AGALACTIAE NOT ROUTINELY  PERFORMED DUE TO PREDICTABILITY OF AMP/PEN/VAN SUSCEPTIBILITY. Performed at [facility]    Report Status PENDING  Incomplete     Basic Metabolic Panel:  Recent Labs  81/19/1406/03/19 0549 12/13/14 0554  NA 140 141  K 3.8 3.7  CL 108 106  CO2 25 29  GLUCOSE 99 97  BUN 6 7  CREATININE 0.76 0.85  CALCIUM 8.3* 8.3*   Liver Function Tests:  Recent Labs  12/12/14 0549  AST 19  ALT 23  ALKPHOS 40  BILITOT 0.5  PROT 6.7  ALBUMIN 3.4*     CBC:  Recent Labs  12/10/14 1522 12/12/14 0549  WBC 13.0* 8.4  NEUTROABS 8.5*  --   HGB 14.6 13.5  HCT 43.9 41.0  MCV 89.2 88.6  PLT 249 198    No results found.    Medications:   Impression: Cerebral palsy Diabetes Active Problems:   Cellulitis     Plan: We'll discharge on vancomycin and Zosyn for an additional 7 days according to protocol monitoring renal function. Metformin 500 by mouth twice a day after termination of and about it says they have both potentially nephrotoxic   Consultants:    Procedures   Antibiotics: Vancomycin and Zosyn                  Code Status: Full  Family Communication:    Disposition Plan see plan above  Time spent: 40 minutes   LOS: 3 days   Bruce Love Love   12/13/2014, 2:07 PM

## 2014-12-14 LAB — HEMOGLOBIN A1C
Hgb A1c MFr Bld: 7.1 % — ABNORMAL HIGH (ref 4.8–5.6)
MEAN PLASMA GLUCOSE: 157 mg/dL

## 2014-12-14 LAB — WOUND CULTURE
Gram Stain: NONE SEEN
Special Requests: NORMAL

## 2014-12-14 MED ORDER — LEVOFLOXACIN 750 MG PO TABS: 750.0000 mg | ORAL_TABLET | Freq: Every day | ORAL | Status: DC

## 2014-12-14 MED ORDER — LIVING WELL WITH DIABETES BOOK
Freq: Once | Status: AC
  Administered 2014-12-14: 13:00:00
  Filled 2014-12-14: qty 1

## 2014-12-14 NOTE — Progress Notes (Addendum)
Spoke with patient about new diabetes diagnosis. Patient reports that he has several family members that have diabetes.  Discussed A1C results (7.1% on 12/13/14) and explained what an A1C is, basic pathophysiology of DM Type 2, basic home care, importance maintaining glycemic control to prevent long-term and short-term complications. Reviewed signs and symptoms of hyperglycemia and hypoglycemia along with treatment for both. Discussed carbohydrates, carbohydrate goals per day and meal, along with portion sizes. Informed patient about free [facility] diabetes [facility] and provided contact information to sign up for the class. Reviewed Living Well with Diabetes booklet with patient and encouraged patient to read over the booklet in detail. Talked with Dr. Janna ArchonDiego and the plan is for patient to follow up in one week with Dr. Janna ArchonDiego and he will then be started on Metformin (after completion of antibiotics).  Patient verbalized understanding of information discussed and he states that he has no further questions at this time related to diabetes.   Thanks, Orlando PennerMarie Jalene Lacko, RN, MSN, CCRN, CDE Diabetes Coordinator Inpatient Diabetes Program [phone] (Team Pager) [phone] ([facility] office) [phone] [facility] office) [phone] [facility] office)

## 2014-12-14 NOTE — Care Management Note (Signed)
Case Management Note  Patient Details  Name: Bruce Love MRN: [id number] Date of Birth: 06-30-1975   Expected Discharge Date:                  Expected Discharge Plan:  Home w Home Health Services  In-House Referral:  NA  Discharge planning Services  CM Consult  Post Acute Care Choice:  Home Health Choice offered to:     DME Arranged:    DME Agency:     HH Arranged:  RN HH Agency:  [facility]  Status of Service:     Medicare Important Message Given:    Date Medicare IM Given:    Medicare IM give by:    Date Additional Medicare IM Given:    Additional Medicare Important Message give by:     If discussed at Long Length of Stay Meetings, dates discussed:    Additional Comments: [facility] is aware and will obtain pt info from chart. [facility] hours to make first visit. No further CM needs.   [name], [name], RN 12/14/2014, 12:52 PM

## 2014-12-14 NOTE — Progress Notes (Signed)
1825 Called Leighton RoachJudy Landfair (patient's contact) x 2 times w/o success regarding patient's d/c home and pending transportation. Patient reports that he has called his mother to come get him but no one has showed up to pick up patient at this time.

## 2014-12-14 NOTE — Discharge Summary (Signed)
Physician Discharge Summary  Bruce Love:[id number] DOB: 04/20/1976 DOA: 12/10/2014  PCP: Isabella Stalling, MD  Admit date: 12/10/2014 Discharge date: 12/14/2014   Recommendations for Outpatient Follow-up:  Patient will follow my office within one week's time to assess degree of resolution of lower extremity cellulitis to begin diabetic oral therapy will not begin size and about it potentially nephrotoxic with concomitant metformin Discharge Diagnoses:  Active Problems:   Cellulitis   Discharge Condition: Good  Filed Weights   12/10/14 1306 12/10/14 1803  Weight: 200 lb (90.719 kg) 219 lb 4.8 oz (99.474 kg)    History of present illness:  She has known cerebral palsy was admitted with bilateral lower extremity cellulitis likewise found to have new-onset diabetes for the last few weeks function remained placed on vancomycin and Zosyn IV with improvement of lower extremity cellulitis certainly changed to outpatient Levaquin 750 by mouth daily for an additional 7 days will not begin metformin as a potentially both nephrotoxic@simultaneously  we'll begin metformin in one week's time after checking renal function patient sent home with home health nurse  Hospital Course:  See history of present illness   Procedures:    Consultations:    Discharge Instructions     Medication List    ASK your doctor about these medications        acetaminophen 500 MG tablet  Commonly known as:  TYLENOL  Take 500 mg by mouth every 6 (six) hours as needed for pain.     lisinopril 40 MG tablet  Commonly known as:  PRINIVIL,ZESTRIL  Take 40 mg by mouth daily.     triamcinolone cream 0.1 %  Commonly known as:  KENALOG  Apply 1 application topically 2 (two) times daily. APPLIED TO LEFT FOOT     TRILIPIX 135 MG capsule  Generic drug:  Choline Fenofibrate  Take 135 mg by mouth at bedtime.       Allergies  Allergen Reactions  . Pravastatin Itching and Rash      The  results of significant diagnostics from this hospitalization (including imaging, microbiology, ancillary and laboratory) are listed below for reference.    Significant Diagnostic Studies: Dg Chest 1 View  12/10/2014   CLINICAL DATA:  Lower extremity redness and swelling.  Hypertension.  EXAM: CHEST  1 VIEW  COMPARISON:  June 12, 2010  FINDINGS: There is no edema or consolidation. Heart is upper normal in size with pulmonary vascularity within normal limits. No adenopathy. No bone lesions.  IMPRESSION: No edema or consolidation.   Electronically Signed   By: Bretta Bang III M.D.   On: 12/10/2014 15:38    Microbiology: Recent Results (from the past 240 hour(s))  Culture, blood (single)     Status: None (Preliminary result)   Collection Time: 12/10/14  3:22 PM  Result Value Ref Range Status   Specimen Description BLOOD LEFT HAND  Final   Special Requests BOTTLES DRAWN AEROBIC AND ANAEROBIC 6CC  Final   Culture NO GROWTH 4 DAYS  Final   Report Status PENDING  Incomplete  Culture, blood (routine x 2)     Status: None (Preliminary result)   Collection Time: 12/10/14  3:41 PM  Result Value Ref Range Status   Specimen Description LEFT ANTECUBITAL  Final   Special Requests BOTTLES DRAWN AEROBIC AND ANAEROBIC 6CC  Final   Culture NO GROWTH 4 DAYS  Final   Report Status PENDING  Incomplete  Wound culture     Status: None   Collection  Time: 12/10/14  5:40 PM  Result Value Ref Range Status   Specimen Description HEEL  Final   Special Requests Normal  Final   Gram Stain   Final    NO WBC SEEN NO SQUAMOUS EPITHELIAL CELLS SEEN RARE GRAM POSITIVE RODS RARE GRAM NEGATIVE RODS Performed at [facility]    Culture   Final    MODERATE STAPHYLOCOCCUS AUREUS Note: RIFAMPIN AND GENTAMICIN SHOULD NOT BE USED AS SINGLE DRUGS FOR TREATMENT OF STAPH INFECTIONS. This organism is presumed to be Clindamycin resistant based on detection of inducible Clindamycin resistance. MODERATE GROUP B  STREP(S.AGALACTIAE)ISOLATED Note: TESTING AGAINST S. AGALACTIAE NOT ROUTINELY PERFORMED DUE TO PREDICTABILITY OF AMP/PEN/VAN SUSCEPTIBILITY. Performed at [facility]    Report Status 12/14/2014 FINAL  Final   Organism ID, Bacteria STAPHYLOCOCCUS AUREUS  Final      Susceptibility   Staphylococcus aureus - MIC*    CLINDAMYCIN RESISTANT      ERYTHROMYCIN RESISTANT      GENTAMICIN <=0.5 SENSITIVE Sensitive     LEVOFLOXACIN <=0.12 SENSITIVE Sensitive     OXACILLIN 0.5 SENSITIVE Sensitive     PENICILLIN >=0.5 RESISTANT Resistant     RIFAMPIN <=0.5 SENSITIVE Sensitive     TRIMETH/SULFA <=10 SENSITIVE Sensitive     VANCOMYCIN 1 SENSITIVE Sensitive     TETRACYCLINE <=1 SENSITIVE Sensitive     MOXIFLOXACIN <=0.25 SENSITIVE Sensitive     * MODERATE STAPHYLOCOCCUS AUREUS     Labs: Basic Metabolic Panel:  Recent Labs Lab 12/10/14 1522 12/11/14 0601 12/12/14 0549 12/13/14 0554  NA 140 140 140 141  K 3.8 3.6 3.8 3.7  CL 107 109 108 106  CO2 24 24 25 29   GLUCOSE 138* 168* 99 97  BUN 11 8 6 7   CREATININE 0.76 0.73 0.76 0.85  CALCIUM 8.9 7.9* 8.3* 8.3*   Liver Function Tests:  Recent Labs Lab 12/12/14 0549  AST 19  ALT 23  ALKPHOS 40  BILITOT 0.5  PROT 6.7  ALBUMIN 3.4*   No results for input(s): LIPASE, AMYLASE in the last 168 hours. No results for input(s): AMMONIA in the last 168 hours. CBC:  Recent Labs Lab 12/10/14 1522 12/12/14 0549  WBC 13.0* 8.4  NEUTROABS 8.5*  --   HGB 14.6 13.5  HCT 43.9 41.0  MCV 89.2 88.6  PLT 249 198   Cardiac Enzymes: No results for input(s): CKTOTAL, CKMB, CKMBINDEX, TROPONINI in the last 168 hours. BNP: BNP (last 3 results) No results for input(s): BNP in the last 8760 hours.  ProBNP (last 3 results) No results for input(s): PROBNP in the last 8760 hours.  CBG: No results for input(s): GLUCAP in the last 168 hours.     Signed:  Akim Watkinson Judie PetitM  [facility] Pager: [phone] 12/14/2014, 12:17 PM

## 2014-12-14 NOTE — Progress Notes (Addendum)
Pt states PO pain medication is not managing pain. On call MD notified, verbal order to give Morphine 2-4mg  PRN. Will follow orders as advised.

## 2014-12-14 NOTE — Progress Notes (Signed)
D/C instructions and paperwork given to patient. Patient reported calling his mother to pick up and take him home. OR nurse notified for removal of patient's PICC line, PICC line removed. Patient awaiting arrival of his mother at this time.

## 2014-12-14 NOTE — Progress Notes (Signed)
[id number] Spoke with patient again regarding his d/c home today and he reported he's still waiting on his ride.

## 2014-12-15 LAB — CULTURE, BLOOD (SINGLE): Culture: NO GROWTH

## 2014-12-15 LAB — CULTURE, BLOOD (ROUTINE X 2): Culture: NO GROWTH

## 2015-10-03 ENCOUNTER — Emergency Department (HOSPITAL_COMMUNITY)
Admission: EM | Admit: 2015-10-03 | Discharge: 2015-10-03 | Disposition: A | Discharge status: Home / Self Care | Payer: Medicaid Other | Attending: Emergency Medicine | Admitting: Emergency Medicine

## 2015-10-03 ENCOUNTER — Encounter (HOSPITAL_COMMUNITY): Payer: Self-pay | Admitting: Emergency Medicine

## 2015-10-03 ENCOUNTER — Emergency Department (HOSPITAL_COMMUNITY): Payer: Medicaid Other

## 2015-10-03 DIAGNOSIS — M25461 Effusion, right knee: Secondary | ICD-10-CM | POA: Insufficient documentation

## 2015-10-03 DIAGNOSIS — I10 Essential (primary) hypertension: Secondary | ICD-10-CM | POA: Insufficient documentation

## 2015-10-03 DIAGNOSIS — M7989 Other specified soft tissue disorders: Secondary | ICD-10-CM | POA: Insufficient documentation

## 2015-10-03 DIAGNOSIS — Z7984 Long term (current) use of oral hypoglycemic drugs: Secondary | ICD-10-CM | POA: Diagnosis not present

## 2015-10-03 DIAGNOSIS — M109 Gout, unspecified: Secondary | ICD-10-CM

## 2015-10-03 DIAGNOSIS — M25561 Pain in right knee: Secondary | ICD-10-CM | POA: Diagnosis present

## 2015-10-03 DIAGNOSIS — Z79899 Other long term (current) drug therapy: Secondary | ICD-10-CM | POA: Insufficient documentation

## 2015-10-03 DIAGNOSIS — F17210 Nicotine dependence, cigarettes, uncomplicated: Secondary | ICD-10-CM | POA: Insufficient documentation

## 2015-10-03 LAB — GRAM STAIN

## 2015-10-03 LAB — SYNOVIAL CELL COUNT + DIFF, W/ CRYSTALS
Eosinophils-Synovial: 0 % (ref 0–1)
Lymphocytes-Synovial Fld: 5 % (ref 0–20)
Monocyte-Macrophage-Synovial Fluid: 4 % — ABNORMAL LOW (ref 50–90)
Neutrophil, Synovial: 91 % — ABNORMAL HIGH (ref 0–25)
OTHER CELLS-SYN: 0
WBC, Synovial: 31200 /mm3 — ABNORMAL HIGH (ref 0–200)

## 2015-10-03 LAB — CBG MONITORING, ED: GLUCOSE-CAPILLARY: 195 mg/dL — AB (ref 65–99)

## 2015-10-03 LAB — URINALYSIS, ROUTINE W REFLEX MICROSCOPIC
Bilirubin Urine: NEGATIVE
GLUCOSE, UA: NEGATIVE mg/dL
HGB URINE DIPSTICK: NEGATIVE
KETONES UR: NEGATIVE mg/dL
Leukocytes, UA: NEGATIVE
Nitrite: NEGATIVE
PROTEIN: 30 mg/dL — AB
Specific Gravity, Urine: 1.02 (ref 1.005–1.030)
pH: 7.5 (ref 5.0–8.0)

## 2015-10-03 LAB — CBC
HCT: 43.3 % (ref 39.0–52.0)
Hemoglobin: 14.7 g/dL (ref 13.0–17.0)
MCH: 28.9 pg (ref 26.0–34.0)
MCHC: 33.9 g/dL (ref 30.0–36.0)
MCV: 85.2 fL (ref 78.0–100.0)
PLATELETS: 195 10*3/uL (ref 150–400)
RBC: 5.08 MIL/uL (ref 4.22–5.81)
RDW: 14.3 % (ref 11.5–15.5)
WBC: 12.2 10*3/uL — AB (ref 4.0–10.5)

## 2015-10-03 LAB — BASIC METABOLIC PANEL
Anion gap: 10 (ref 5–15)
BUN: 10 mg/dL (ref 6–20)
CALCIUM: 9.2 mg/dL (ref 8.9–10.3)
CHLORIDE: 101 mmol/L (ref 101–111)
CO2: 28 mmol/L (ref 22–32)
CREATININE: 0.72 mg/dL (ref 0.61–1.24)
GFR calc non Af Amer: >60 mL/min (ref >60)
Glucose, Bld: 191 mg/dL — ABNORMAL HIGH (ref 65–99)
Potassium: 3.8 mmol/L (ref 3.5–5.1)
SODIUM: 139 mmol/L (ref 135–145)

## 2015-10-03 LAB — URINE MICROSCOPIC-ADD ON: Bacteria, UA: NONE SEEN

## 2015-10-03 LAB — LACTIC ACID, PLASMA: LACTIC ACID, VENOUS: 1.8 mmol/L (ref 0.5–2.0)

## 2015-10-03 MED ORDER — HYDROMORPHONE HCL 1 MG/ML IJ SOLN
INTRAMUSCULAR | Status: AC
  Administered 2015-10-03: 1 mg via INTRAVENOUS
  Filled 2015-10-03: qty 1

## 2015-10-03 MED ORDER — COLCHICINE 0.6 MG PO TABS: 0.6000 mg | ORAL_TABLET | Freq: Two times a day (BID) | ORAL | Status: DC

## 2015-10-03 MED ORDER — ONDANSETRON HCL 4 MG/2ML IJ SOLN
INTRAMUSCULAR | Status: AC
  Administered 2015-10-03: 4 mg via INTRAVENOUS
  Filled 2015-10-03: qty 2

## 2015-10-03 MED ORDER — ONDANSETRON HCL 4 MG/2ML IJ SOLN
4.0000 mg | Freq: Once | INTRAMUSCULAR | Status: AC
  Administered 2015-10-03: 4 mg via INTRAVENOUS

## 2015-10-03 MED ORDER — ONDANSETRON HCL 4 MG PO TABS
4.0000 mg | ORAL_TABLET | Freq: Once | ORAL | Status: AC
  Administered 2015-10-03: 4 mg via ORAL
  Filled 2015-10-03: qty 1

## 2015-10-03 MED ORDER — MORPHINE SULFATE (PF) 4 MG/ML IV SOLN
4.0000 mg | Freq: Once | INTRAVENOUS | Status: AC
  Administered 2015-10-03: 4 mg via INTRAVENOUS
  Filled 2015-10-03: qty 1

## 2015-10-03 MED ORDER — HYDROMORPHONE HCL 1 MG/ML IJ SOLN
1.0000 mg | Freq: Once | INTRAMUSCULAR | Status: AC
  Administered 2015-10-03: 1 mg via INTRAVENOUS

## 2015-10-03 MED ORDER — OXYCODONE-ACETAMINOPHEN 5-325 MG PO TABS: 2.0000 | ORAL_TABLET | ORAL | Status: DC | PRN

## 2015-10-03 MED ORDER — COLCHICINE 0.6 MG PO TABS
0.6000 mg | ORAL_TABLET | Freq: Once | ORAL | Status: AC
  Administered 2015-10-03: 0.6 mg via ORAL
  Filled 2015-10-03: qty 1

## 2015-10-03 MED ORDER — KETOROLAC TROMETHAMINE 30 MG/ML IJ SOLN
30.0000 mg | Freq: Once | INTRAMUSCULAR | Status: AC
Start: 2015-10-03 — End: 2015-10-03
  Administered 2015-10-03: 30 mg via INTRAVENOUS
  Filled 2015-10-03: qty 1

## 2015-10-03 MED ORDER — INDOMETHACIN 25 MG PO CAPS: 25.0000 mg | ORAL_CAPSULE | Freq: Three times a day (TID) | ORAL | Status: DC | PRN

## 2015-10-03 MED ORDER — DEXAMETHASONE SODIUM PHOSPHATE 10 MG/ML IJ SOLN
10.0000 mg | Freq: Once | INTRAMUSCULAR | Status: AC
  Administered 2015-10-03: 10 mg via INTRAVENOUS
  Filled 2015-10-03: qty 1

## 2015-10-03 NOTE — ED Notes (Signed)
Dr. Fayrene FearingJames at bedside performing ultrasound guided knee aspiration.  [facility] is assisting for pain medication for pt to tolerate procedure.  Unable to reach computer to scan medications.

## 2015-10-03 NOTE — ED Notes (Signed)
Pt here for right knee pain with no known injury.  No swelling or redness noted to knee, but redness, swelling, and warmth noted to both legs up to midcalf.  States this is a normal finding for him.  Pt states he walks, but motorized wheelchair present in room and feet are inverted.  Scaling noted bilaterally with long yellow toenails.  Pt states he has "borderline diabetus" and the doctor told him he just needed to take his pill.  Pt states he has no dietary restrictions and ate [employer] bacon, egg, and cheese and had 3 cups of coffee with about 15 packs of sugar.  Educated patient on dietary control of glucose.

## 2015-10-03 NOTE — Discharge Instructions (Signed)

## 2015-10-03 NOTE — ED Notes (Signed)
Pt reports RT knee pain that began last night. States that when he was a child he had to get fluid drained from his knee, but has not had problems since then. Denies injury or fall. Reports increased difficulty in ambulation.

## 2015-10-03 NOTE — ED Notes (Signed)
Per pt, able to talk to son about his visit

## 2015-10-03 NOTE — ED Provider Notes (Signed)
CSN: [id number]     Arrival date & time 10/03/15  1128 History  By signing my name below, I, Tanda RockersMargaux Venter, attest that this documentation has been prepared under the direction and in the presence of Ivery QualeHobson Naleyah Ohlinger, PA-C.  Electronically Signed: Tanda RockersMargaux Venter, ED Scribe. 10/03/2015. 12:51 PM.   Chief Complaint  Patient presents with  . Knee Pain   Patient is a 40 y.o. male presenting with knee pain. The history is provided by the patient. No language interpreter was used.  Knee Pain Location:  Knee Time since incident:  1 day Injury: no   Knee location:  R knee Pain details:    Radiates to:  Does not radiate   Severity:  Moderate   Onset quality:  Gradual   Duration:  1 day   Timing:  Constant   Progression:  Unchanged Chronicity:  New Relieved by:  None tried Worsened by:  Extension and flexion Ineffective treatments:  None tried Associated symptoms: swelling   Associated symptoms: no fever      HPI Comments: Bruce Love is a 40 y.o. male with PMHx cerebral palsy, HTN, and HLD who presents to the Emergency Department complaining of gradual onset, constant, right knee pain that began last night. No known injury to the knee. The pain is exacerbated with bending and movement of the knee. Upon triage, nurse noted redness, swelling, and warmth to both legs up to the mid calf area. Pt told the triage nurse that this is normal for him. Pt states that he has been seen by PCP, Dr. Janna Archondiego, for his legs and has been told to keep the legs elevated. Pt has a hospital bed at his home that he uses to elevate his BLEs. Pt has a home health nurse who has seen his legs and believes that it is mildly improved after elevation. He does note itching to the legs and has been applying Gold Bond to the areas without relief. Denies fever, chills, or any other associated symptoms. Pt has hx of borderline diabetes and is on metformin.   Past Medical History  Diagnosis Date  . Hypertension   . High  cholesterol   . Premature birth   . Arthritis   . Cerebral palsy [facility](HCC)    Past Surgical History  Procedure Laterality Date  . Appendectomy    . Heel spur surgery     Family History  Problem Relation Age of Onset  . Diabetes Other   . Hypertension Other   . Rheum arthritis Other    Social History  Substance Use Topics  . Smoking status: Current Every Day Smoker -- 1.00 packs/day    Types: Cigarettes  . Smokeless tobacco: Never Used  . Alcohol Use: No    Review of Systems  Constitutional: Negative for fever and chills.  Musculoskeletal: Positive for joint swelling (right knee) and arthralgias (right knee).  All other systems reviewed and are negative.  Allergies  Pravastatin  Home Medications   Prior to Admission medications   Medication Sig Start Date End Date Taking? Authorizing Provider  Choline Fenofibrate (TRILIPIX) 135 MG capsule Take 135 mg by mouth at bedtime.     Historical Provider, MD  levofloxacin (LEVAQUIN) 750 MG tablet Take 1 tablet (750 mg total) by mouth daily. 12/14/14   Oval Linseyichard Dondiego, MD  lisinopril (PRINIVIL,ZESTRIL) 40 MG tablet Take 40 mg by mouth daily.      Historical Provider, MD  triamcinolone cream (KENALOG) 0.1 % Apply 1 application topically 2 (two) times  daily. APPLIED TO LEFT FOOT    Historical Provider, MD   BP 141/92 mmHg  Pulse 85  Temp(Src) 99.5 F (37.5 C) (Oral)  Resp 20  Ht  (1.702 m)  Wt 210 lb (95.255 kg)  BMI 32.88 kg/m2  SpO2 99%   Physical Exam  Constitutional: He is oriented to person, place, and time. He appears well-developed and well-nourished. No distress.  HENT:  Head: Normocephalic and atraumatic.  Eyes: Conjunctivae and EOM are normal.  Neck: Neck supple. No tracheal deviation present.  Cardiovascular: Normal rate.   Pulmonary/Chest: Effort normal. No respiratory distress.  Musculoskeletal:  The right knee is warm to touch. There is an effusion present. There is increased redness and warmth to both  lower legs. There are multiple skin lesions of right and left legs. There is swelling of right and left feet. Capillar refill is < 2 seconds. DP pulses are 2+ on the left, 1+ on the right.   Neurological: He is alert and oriented to person, place, and time.  Skin: Skin is warm and dry.  Psychiatric: He has a normal mood and affect. His behavior is normal.  Nursing note and vitals reviewed.   ED Course  Procedures (including critical care time)  DIAGNOSTIC STUDIES: Oxygen Saturation is 99% on RA, normal by my interpretation.    COORDINATION OF CARE: 12:49 PM-Discussed treatment plan which includes DG R Knee, BMP, CBC, CBG, UA with pt at bedside and pt agreed to plan.   1:55 PM - Pt was seen by Dr. Hyacinth Meeker and it was determined that we need to evaluated for possible septic joint of the right knee. Pt will be moved to the acute care area.   Labs Review Labs Reviewed - No data to display  Imaging Review No results found. I have personally reviewed and evaluated these images and lab results as part of my medical decision-making.   EKG Interpretation None      MDM  Temperature on admission was 99.5, this is improved 98.1. The blood pressure remains steady. The pulse oximetry is 99-100% on room air.  The lactic acid is within normal limits at 1.8. The urine analysis is negative for acute infection. The basic metabolic panel shows a glucose to be 191, otherwise within normal limits. The complete blood count shows an elevation in the white blood cells of 12,200, there is no shift to the left. Otherwise the complete blood count is well within normal limits.  X-ray of the right knee is normal. There is no fracture or dislocation appreciated. The knee is warm to touch, and there seems to be some bilateral cellulitis of the lower extremities. The patient will be moved to the acute care area to obtain cultures of the right knee to rule out any evidence of infected or septic joint.   Patient's  care will be continued by Dr. Hyacinth Meeker.    Final diagnoses:  None    **I personally performed the services described in this documentation, which was scribed in my presence. The recorded information has been reviewed and is accurate.  I have reviewed nursing notes, vital signs, and all appropriate lab and imaging results for this patient.Ivery Quale, PA-C 10/03/15 1406  Rolland Porter, MD 10/03/15 2017

## 2015-10-03 NOTE — ED Notes (Signed)
Pt states understanding of care given and follow up instructions.  Left ED in motorized wheelchair

## 2015-10-08 LAB — CULTURE, BODY FLUID-BOTTLE: CULTURE: NO GROWTH

## 2015-10-08 LAB — CULTURE, BODY FLUID W GRAM STAIN -BOTTLE

## 2015-11-15 ENCOUNTER — Emergency Department (HOSPITAL_COMMUNITY)
Admission: EM | Admit: 2015-11-15 | Discharge: 2015-11-15 | Disposition: A | Discharge status: Home / Self Care | Payer: Medicaid Other | Attending: Emergency Medicine | Admitting: Emergency Medicine

## 2015-11-15 ENCOUNTER — Encounter (HOSPITAL_COMMUNITY): Payer: Self-pay | Admitting: *Deleted

## 2015-11-15 DIAGNOSIS — K429 Umbilical hernia without obstruction or gangrene: Secondary | ICD-10-CM | POA: Insufficient documentation

## 2015-11-15 DIAGNOSIS — M1990 Unspecified osteoarthritis, unspecified site: Secondary | ICD-10-CM | POA: Diagnosis not present

## 2015-11-15 DIAGNOSIS — I10 Essential (primary) hypertension: Secondary | ICD-10-CM | POA: Diagnosis not present

## 2015-11-15 DIAGNOSIS — Z7984 Long term (current) use of oral hypoglycemic drugs: Secondary | ICD-10-CM | POA: Diagnosis not present

## 2015-11-15 DIAGNOSIS — F17210 Nicotine dependence, cigarettes, uncomplicated: Secondary | ICD-10-CM | POA: Diagnosis not present

## 2015-11-15 DIAGNOSIS — E119 Type 2 diabetes mellitus without complications: Secondary | ICD-10-CM | POA: Diagnosis not present

## 2015-11-15 DIAGNOSIS — R109 Unspecified abdominal pain: Secondary | ICD-10-CM | POA: Diagnosis present

## 2015-11-15 HISTORY — DX: Type 2 diabetes mellitus without complications: E11.9

## 2015-11-15 NOTE — ED Provider Notes (Signed)
CSN: [id number]     Arrival date & time 11/15/15  0033 History   First MD Initiated Contact with Patient 11/15/15 0156     Chief Complaint  Patient presents with  . Abdominal Pain     (Consider location/radiation/quality/duration/timing/severity/associated sxs/prior Treatment) Patient is a 40 y.o. male presenting with abdominal pain. The history is provided by the patient.  Abdominal Pain He has a history of cerebral palsy and is wheelchair bound, also history of diabetes and hypertension. He had an episode of mid abdominal pain this evening and noted some swelling in the periumbilical area and was concerned that the swollen area might burst. Pain was transient and was mild. He rated at 1/10. He has no pain currently. There is no nausea or vomiting. He denies fever or chills.  Past Medical History  Diagnosis Date  . Hypertension   . High cholesterol   . Premature birth   . Arthritis   . Cerebral palsy (HCC)   . Diabetes mellitus without complication (HCC)     type 2   Past Surgical History  Procedure Laterality Date  . Appendectomy    . Heel spur surgery     Family History  Problem Relation Age of Onset  . Diabetes Other   . Hypertension Other   . Rheum arthritis Other    Social History  Substance Use Topics  . Smoking status: Current Every Day Smoker -- 1.00 packs/day    Types: Cigarettes  . Smokeless tobacco: Never Used  . Alcohol Use: No    Review of Systems  Gastrointestinal: Positive for abdominal pain.  All other systems reviewed and are negative.     Allergies  Pravastatin  Home Medications   Prior to Admission medications   Medication Sig Start Date End Date Taking? Authorizing Provider  colchicine 0.6 MG tablet Take 1 tablet (0.6 mg total) by mouth 2 (two) times daily. 10/03/15   Rolland PorterMark James, MD  HYDROcodone-acetaminophen (NORCO) 10-325 MG tablet Take 1 tablet by mouth every 6 (six) hours as needed.    Historical Provider, MD  indomethacin (INDOCIN) 25  MG capsule Take 1 capsule (25 mg total) by mouth 3 (three) times daily as needed. 10/03/15   Rolland PorterMark James, MD  lisinopril (PRINIVIL,ZESTRIL) 40 MG tablet Take 40 mg by mouth daily.      Historical Provider, MD  metFORMIN (GLUCOPHAGE) 500 MG tablet Take 500 mg by mouth 2 (two) times daily with a meal.    Historical Provider, MD  oxyCODONE-acetaminophen (PERCOCET/ROXICET) 5-325 MG tablet Take 2 tablets by mouth every 4 (four) hours as needed. 10/03/15   Rolland PorterMark James, MD  pravastatin (PRAVACHOL) 40 MG tablet Take 40 mg by mouth daily.    Historical Provider, MD   BP 145/91 mmHg  Pulse 81  Temp(Src) 97.9 F (36.6 C) (Oral)  Resp 20  Ht 5\' 7"  (1.702 m)  Wt 210 lb (95.255 kg)  BMI 32.88 kg/m2  SpO2 95% Physical Exam  Nursing note and vitals reviewed.  40 year old male, resting comfortably and in no acute distress. Vital signs are  significant for hypertension. Oxygen saturation is 95%, which is normal. Head is normocephalic and atraumatic. PERRLA, EOMI. Oropharynx is clear. Neck is nontender and supple without adenopathy or JVD. Back is nontender and there is no CVA tenderness. Lungs are clear without rales, wheezes, or rhonchi. Chest is nontender. Heart has regular rate and rhythm without murmur. Abdomen is soft, flat, nontender. There is a small to moderate size umbilical hernia which  is soft and easily reducible and nontender. There is no  hepatosplenomegaly and peristalsis is normoactive. Extremities have no cyanosis or edema, full range of motion is present. Skin is warm and dry without rash. Neurologic: Mental status is normal, cranial nerves are intact.  ED Course  Procedures (including critical care time)   MDM   Final diagnoses:  Umbilical hernia without obstruction and without gangrene   Umbilical hernia without sign of incarceration. Patient is reassured and referred back to PCP. Advised that he can have surgical repair if he is concerned about it. Return precautions given. Old  records are reviewed, and he has no relevant visits and has not had any imaging of his abdomen. No indication for laboratory evaluation or CT scan today.    Dione Booze, MD 11/15/15 [phone]

## 2015-11-15 NOTE — ED Notes (Signed)
Pt c/o pain to his umbilical hernia that has gotten worse today

## 2015-11-15 NOTE — Discharge Instructions (Signed)
Hernia, Adult A hernia is the bulging of an organ or tissue through a weak spot in the muscles of the abdomen (abdominal wall). Hernias develop most often near the navel or groin. There are many kinds of hernias. Common kinds include:  Femoral hernia. This kind of hernia develops under the groin in the upper thigh area.  Inguinal hernia. This kind of hernia develops in the groin or scrotum.  Umbilical hernia. This kind of hernia develops near the navel.  Hiatal hernia. This kind of hernia causes part of the stomach to be pushed up into the chest.  Incisional hernia. This kind of hernia bulges through a scar from an abdominal surgery. CAUSES This condition may be caused by:  Heavy lifting.  Coughing over a long period of time.  Straining to have a bowel movement.  An incision made during an abdominal surgery.  A birth defect (congenital defect).  Excess weight or obesity.  Smoking.  Poor nutrition.  Cystic fibrosis.  Excess fluid in the abdomen.  Undescended testicles. SYMPTOMS Symptoms of a hernia include:  A lump on the abdomen. This is the first sign of a hernia. The lump may become more obvious with standing, straining, or coughing. It may get bigger over time if it is not treated or if the condition causing it is not treated.  Pain. A hernia is usually painless, but it may become painful over time if treatment is delayed. The pain is usually dull and may get worse with standing or lifting heavy objects. Sometimes a hernia gets tightly squeezed in the weak spot (strangulated) or stuck there (incarcerated) and causes additional symptoms. These symptoms may include:  Vomiting.  Nausea.  Constipation.  Irritability. DIAGNOSIS A hernia may be diagnosed with:  A physical exam. During the exam your health care provider may ask you to cough or to make a specific movement, because a hernia is usually more visible when you move.  Imaging tests. These can  include:  X-rays.  Ultrasound.  CT scan. TREATMENT A hernia that is small and painless may not need to be treated. A hernia that is large or painful may be treated with surgery. Inguinal hernias may be treated with surgery to prevent incarceration or strangulation. Strangulated hernias are always treated with surgery, because lack of blood to the trapped organ or tissue can cause it to die. Surgery to treat a hernia involves pushing the bulge back into place and repairing the weak part of the abdomen. HOME CARE INSTRUCTIONS  Avoid straining.  Do not lift anything heavier than 10 lb (4.5 kg).  Lift with your leg muscles, not your back muscles. This helps avoid strain.  When coughing, try to cough gently.  Prevent constipation. Constipation leads to straining with bowel movements, which can make a hernia worse or cause a hernia repair to break down. You can prevent constipation by:  Eating a high-fiber diet that includes plenty of fruits and vegetables.  Drinking enough fluids to keep your urine clear or pale yellow. Aim to drink 6-8 glasses of water per day.  Using a stool softener as directed by your health care provider.  Lose weight, if you are overweight.  Do not use any tobacco products, including cigarettes, chewing tobacco, or electronic cigarettes. If you need help quitting, ask your health care provider.  Keep all follow-up visits as directed by your health care provider. This is important. Your health care provider may need to monitor your condition. SEEK MEDICAL CARE IF:  You have   swelling, redness, and pain in the affected area.  Your bowel habits change. SEEK IMMEDIATE MEDICAL CARE IF:  You have a fever.  You have abdominal pain that is getting worse.  You feel nauseous or you vomit.  You cannot push the hernia back in place by gently pressing on it while you are lying down.  The hernia:  Changes in shape or size.  Is stuck outside the  abdomen.  Becomes discolored.  Feels hard or tender.   This information is not intended to replace advice given to you by your health care provider. Make sure you discuss any questions you have with your health care provider.   Document Released: 05/21/2005 Document Revised: 06/11/2014 Document Reviewed: 03/31/2014 Elsevier Interactive Patient Education 2016 [employer]  

## 2015-12-01 ENCOUNTER — Emergency Department (HOSPITAL_COMMUNITY)
Admission: EM | Admit: 2015-12-01 | Discharge: 2015-12-01 | Discharge status: Against Medical Advice | Payer: Medicaid Other

## 2015-12-01 NOTE — ED Notes (Signed)
Call from registration, pt has elected to leave and was seen leaving the department

## 2016-03-21 ENCOUNTER — Encounter (HOSPITAL_COMMUNITY): Payer: Self-pay

## 2016-03-21 ENCOUNTER — Emergency Department (HOSPITAL_COMMUNITY)
Admission: EM | Admit: 2016-03-21 | Discharge: 2016-03-21 | Disposition: A | Discharge status: Home / Self Care | Payer: Medicaid Other | Attending: Emergency Medicine | Admitting: Emergency Medicine

## 2016-03-21 DIAGNOSIS — Z7984 Long term (current) use of oral hypoglycemic drugs: Secondary | ICD-10-CM | POA: Insufficient documentation

## 2016-03-21 DIAGNOSIS — K439 Ventral hernia without obstruction or gangrene: Secondary | ICD-10-CM

## 2016-03-21 DIAGNOSIS — E119 Type 2 diabetes mellitus without complications: Secondary | ICD-10-CM | POA: Insufficient documentation

## 2016-03-21 DIAGNOSIS — I10 Essential (primary) hypertension: Secondary | ICD-10-CM | POA: Insufficient documentation

## 2016-03-21 DIAGNOSIS — F17210 Nicotine dependence, cigarettes, uncomplicated: Secondary | ICD-10-CM | POA: Diagnosis not present

## 2016-03-21 DIAGNOSIS — Z79899 Other long term (current) drug therapy: Secondary | ICD-10-CM | POA: Insufficient documentation

## 2016-03-21 DIAGNOSIS — R109 Unspecified abdominal pain: Secondary | ICD-10-CM | POA: Diagnosis present

## 2016-03-21 HISTORY — DX: Umbilical hernia without obstruction or gangrene: K42.9

## 2016-03-21 LAB — URINALYSIS, ROUTINE W REFLEX MICROSCOPIC
Bilirubin Urine: NEGATIVE
GLUCOSE, UA: NEGATIVE mg/dL
Hgb urine dipstick: NEGATIVE
KETONES UR: NEGATIVE mg/dL
Leukocytes, UA: NEGATIVE
Nitrite: NEGATIVE
PH: 6 (ref 5.0–8.0)
Protein, ur: NEGATIVE mg/dL
SPECIFIC GRAVITY, URINE: 1.02 (ref 1.005–1.030)

## 2016-03-21 LAB — LIPASE, BLOOD: Lipase: 33 U/L (ref 11–51)

## 2016-03-21 LAB — COMPREHENSIVE METABOLIC PANEL
ALBUMIN: 4.3 g/dL (ref 3.5–5.0)
ALK PHOS: 51 U/L (ref 38–126)
ALT: 53 U/L (ref 17–63)
AST: 54 U/L — ABNORMAL HIGH (ref 15–41)
Anion gap: 7 (ref 5–15)
BILIRUBIN TOTAL: 0.6 mg/dL (ref 0.3–1.2)
BUN: 8 mg/dL (ref 6–20)
CALCIUM: 9.1 mg/dL (ref 8.9–10.3)
CO2: 28 mmol/L (ref 22–32)
Chloride: 102 mmol/L (ref 101–111)
Creatinine, Ser: 0.61 mg/dL (ref 0.61–1.24)
GFR calc Af Amer: >60 mL/min (ref >60)
GLUCOSE: 163 mg/dL — AB (ref 65–99)
POTASSIUM: 3.9 mmol/L (ref 3.5–5.1)
Sodium: 137 mmol/L (ref 135–145)
TOTAL PROTEIN: 7.9 g/dL (ref 6.5–8.1)

## 2016-03-21 LAB — CBC
HEMATOCRIT: 45.8 % (ref 39.0–52.0)
Hemoglobin: 15.6 g/dL (ref 13.0–17.0)
MCH: 29.2 pg (ref 26.0–34.0)
MCHC: 34.1 g/dL (ref 30.0–36.0)
MCV: 85.8 fL (ref 78.0–100.0)
Platelets: 188 10*3/uL (ref 150–400)
RBC: 5.34 MIL/uL (ref 4.22–5.81)
RDW: 14.2 % (ref 11.5–15.5)
WBC: 9.5 10*3/uL (ref 4.0–10.5)

## 2016-03-21 NOTE — Discharge Instructions (Signed)
You have a hernia. This will need to be repaired by a general surgeon. Phone number given for referral.

## 2016-03-21 NOTE — ED Triage Notes (Signed)
Pt reports that he has umbilical hernia and the hernia started hurting last night. No N/V/D.

## 2016-03-21 NOTE — ED Provider Notes (Signed)
[facility]-EMERGENCY DEPT Provider Note   CSN: [id number] Arrival date & time: 03/21/16  1424     History   Chief Complaint Chief Complaint  Patient presents with  . Abdominal Pain    HPI Bruce Love is a 40 y.o. male.  Ventral hernia for unknown length of time. Now hernia has protruded from his abdomen. No vomiting or significant abdominal pain. Patient has cerebral palsy. Patient was unable to reduce the hernia.      Past Medical History:  Diagnosis Date  . Arthritis   . Cerebral palsy (HCC)   . Diabetes mellitus without complication (HCC)    type 2  . High cholesterol   . Hypertension   . Premature birth   . Umbilical hernia     Patient Active Problem List   Diagnosis Date Noted  . Cellulitis 12/10/2014  . Hypertension 12/10/2014  . Cerebral palsy (HCC) 12/10/2014  . High cholesterol 12/10/2014    Past Surgical History:  Procedure Laterality Date  . APPENDECTOMY    . HEEL SPUR SURGERY         Home Medications    Prior to Admission medications   Medication Sig Start Date End Date Taking? Authorizing Provider  acetaminophen (TYLENOL) 650 MG CR tablet Take 1,950 mg by mouth every 8 (eight) hours as needed for pain.   Yes Historical Provider, MD  HYDROcodone-acetaminophen (NORCO) 10-325 MG tablet Take 1 tablet by mouth every 6 (six) hours as needed for moderate pain.    Yes Historical Provider, MD  lisinopril (PRINIVIL,ZESTRIL) 40 MG tablet Take 40 mg by mouth daily.     Yes Historical Provider, MD  metFORMIN (GLUCOPHAGE) 500 MG tablet Take 500 mg by mouth 2 (two) times daily with a meal.   Yes Historical Provider, MD  pravastatin (PRAVACHOL) 40 MG tablet Take 40 mg by mouth at bedtime.    Yes Historical Provider, MD    Family History Family History  Problem Relation Age of Onset  . Diabetes Other   . Hypertension Other   . Rheum arthritis Other     Social History Social History  Substance Use Topics  . Smoking status: Current Every Day Smoker     Packs/day: 1.00    Types: Cigarettes  . Smokeless tobacco: Never Used  . Alcohol use No     Allergies   Review of patient's allergies indicates no active allergies.   Review of Systems Review of Systems  All other systems reviewed and are negative.    Physical Exam Updated Vital Signs BP 141/93   Pulse 80   Temp 98.3 F (36.8 C) (Oral)   Resp 16   Ht 5\' 7"  (1.702 m)   Wt 210 lb (95.3 kg)   SpO2 96%   BMI 32.89 kg/m   Physical Exam  Constitutional: He is oriented to person, place, and time. He appears well-developed and well-nourished.  HENT:  Head: Normocephalic and atraumatic.  Eyes: Conjunctivae are normal.  Neck: Neck supple.  Cardiovascular: Normal rate and regular rhythm.   Pulmonary/Chest: Effort normal and breath sounds normal.  Abdominal:  2.5 x 2.5 cm periumbilical hernia  Musculoskeletal: Normal range of motion.  Neurological: He is alert and oriented to person, place, and time.  Skin: Skin is warm and dry.  Psychiatric: He has a normal mood and affect. His behavior is normal.  Nursing note and vitals reviewed.    ED Treatments / Results  Labs (all labs ordered are listed, but only abnormal results are displayed)  Labs Reviewed  COMPREHENSIVE METABOLIC PANEL - Abnormal; Notable for the following:       Result Value   Glucose, Bld 163 (*)    AST 54 (*)    All other components within normal limits  LIPASE, BLOOD  CBC  URINALYSIS, ROUTINE W REFLEX MICROSCOPIC (NOT AT [facility])    EKG  EKG Interpretation None       Radiology No results found.  Procedures Hernia reduction Date/Time: 03/21/2016 5:00 PM Performed by: Donnetta Hutching Authorized by: Donnetta Hutching  Consent: Verbal consent obtained. Consent given by: patient Patient understanding: patient states understanding of the procedure being performed Local anesthesia used: no  Anesthesia: Local anesthesia used: no  Sedation: Patient sedated: no Patient tolerance: Patient tolerated  the procedure well with no immediate complications Comments: Ventral hernia was reduced with manual pressure.  Patient felt improvement in his pain.    (including critical care time)  Medications Ordered in ED Medications - No data to display   Initial Impression / Assessment and Plan / ED Course  I have reviewed the triage vital signs and the nursing notes.  Pertinent labs & imaging results that were available during my care of the patient were reviewed by me and considered in my medical decision making (see chart for details).  Clinical Course    Patient presents with obvious ventral hernia. I was able to reduce it without complication. I recommended that he see a general surgeon for a hernia repair  Final Clinical Impressions(s) / ED Diagnoses   Final diagnoses:  Ventral hernia without obstruction or gangrene    New Prescriptions Discharge Medication List as of 03/21/2016  6:00 PM       Donnetta Hutching, MD 03/21/16 1823

## 2016-08-02 ENCOUNTER — Ambulatory Visit: Payer: Self-pay | Admitting: General Surgery

## 2016-08-23 ENCOUNTER — Ambulatory Visit: Payer: Self-pay | Admitting: General Surgery

## 2016-10-26 ENCOUNTER — Emergency Department (HOSPITAL_COMMUNITY): Payer: Medicaid Other

## 2016-10-26 ENCOUNTER — Emergency Department (HOSPITAL_COMMUNITY)
Admission: EM | Admit: 2016-10-26 | Discharge: 2016-10-27 | Disposition: A | Discharge status: Home / Self Care | Payer: Medicaid Other | Attending: Emergency Medicine | Admitting: Emergency Medicine

## 2016-10-26 ENCOUNTER — Encounter (HOSPITAL_COMMUNITY): Payer: Self-pay | Admitting: Emergency Medicine

## 2016-10-26 DIAGNOSIS — G809 Cerebral palsy, unspecified: Secondary | ICD-10-CM | POA: Insufficient documentation

## 2016-10-26 DIAGNOSIS — M25461 Effusion, right knee: Secondary | ICD-10-CM | POA: Diagnosis not present

## 2016-10-26 DIAGNOSIS — E119 Type 2 diabetes mellitus without complications: Secondary | ICD-10-CM | POA: Diagnosis not present

## 2016-10-26 DIAGNOSIS — M25561 Pain in right knee: Secondary | ICD-10-CM | POA: Diagnosis present

## 2016-10-26 DIAGNOSIS — I10 Essential (primary) hypertension: Secondary | ICD-10-CM | POA: Insufficient documentation

## 2016-10-26 DIAGNOSIS — Z79899 Other long term (current) drug therapy: Secondary | ICD-10-CM | POA: Diagnosis not present

## 2016-10-26 DIAGNOSIS — F17210 Nicotine dependence, cigarettes, uncomplicated: Secondary | ICD-10-CM | POA: Diagnosis not present

## 2016-10-26 DIAGNOSIS — Z7984 Long term (current) use of oral hypoglycemic drugs: Secondary | ICD-10-CM | POA: Diagnosis not present

## 2016-10-26 NOTE — ED Triage Notes (Signed)
R knee pain for several days with swelling  Bilateral leg blisters being followed by Dr Delbert Harnesson Diego  Dr Wilhemina Bonitoon Deigo is PCP

## 2016-10-26 NOTE — ED Provider Notes (Signed)
[facility] DEPT Provider Note   CSN: [id number] Arrival date & time: 10/26/16  2159  By signing my name below, I, Bruce Love, attest that this documentation has been prepared under the direction and in the presence of [name], [name], *. Electronically Signed: Thelma BargeNick Love, Scribe. 10/26/16. 11:59 PM.   History   Chief Complaint Chief Complaint  Patient presents with  . Leg Pain  . Knee Pain   The history is provided by the patient. No language interpreter was used.    HPI Comments: Bruce Love is a 41 y.o. male who presents to the Emergency Department complaining of constant, gradually worsening right-sided knee pain and swelling since yesterday. He states his knee popped when he was leaning on a hospital bed with his knee. He has had fluid drawn from this knee previously. He denies fever and falling on the knee. He is not on blood thinners.  Past Medical History:  Diagnosis Date  . Arthritis   . Cerebral palsy (HCC)   . Diabetes mellitus without complication (HCC)    type 2  . High cholesterol   . Hypertension   . Premature birth   . Umbilical hernia     Patient Active Problem List   Diagnosis Date Noted  . Cellulitis 12/10/2014  . Hypertension 12/10/2014  . Cerebral palsy (HCC) 12/10/2014  . High cholesterol 12/10/2014    Past Surgical History:  Procedure Laterality Date  . APPENDECTOMY    . HEEL SPUR SURGERY         Home Medications    Prior to Admission medications   Medication Sig Start Date End Date Taking? Authorizing Provider  acetaminophen (TYLENOL) 500 MG tablet Take 1,500 mg by mouth daily as needed for headache.   Yes [provider]  HYDROcodone-acetaminophen (NORCO) 10-325 MG tablet Take 1 tablet by mouth every 6 (six) hours as needed for moderate pain.    Yes [provider]  lisinopril (PRINIVIL,ZESTRIL) 40 MG tablet Take 40 mg by mouth daily.     Yes [provider]  metFORMIN (GLUCOPHAGE) 500 MG  tablet Take 500 mg by mouth 2 (two) times daily with a meal.   Yes [provider]  pravastatin (PRAVACHOL) 40 MG tablet Take 40 mg by mouth at bedtime.    Yes [provider]  ibuprofen (ADVIL,MOTRIN) 800 MG tablet Take 1 tablet (800 mg total) by mouth 3 (three) times daily. 10/27/16   [name], [name], MD    Family History Family History  Problem Relation Age of Onset  . Diabetes Other   . Hypertension Other   . Rheum arthritis Other     Social History Social History  Substance Use Topics  . Smoking status: Current Every Day Smoker    Packs/day: 1.00    Types: Cigarettes  . Smokeless tobacco: Never Used  . Alcohol use No     Allergies   Seroquel [quetiapine fumarate]   Review of Systems Review of Systems  Constitutional: Negative for fever.  Musculoskeletal: Positive for arthralgias and joint swelling.  All other systems reviewed and are negative.    Physical Exam Updated Vital Signs BP 138/87 (BP Location: Right Arm)   Pulse 88   Temp 99 F (37.2 C) (Oral)   Resp 20   Ht 5\' 7"  (1.702 m)   Wt 95.3 kg (210 lb)   SpO2 92%   BMI 32.89 kg/m   Physical Exam  Constitutional: He is oriented to person, place, and time. He appears well-developed  and well-nourished. No distress.  HENT:  Head: Normocephalic and atraumatic.  Right Ear: Hearing normal.  Left Ear: Hearing normal.  Nose: Nose normal.  Mouth/Throat: Oropharynx is clear and moist and mucous membranes are normal.  Eyes: Conjunctivae and EOM are normal. Pupils are equal, round, and reactive to light.  Neck: Normal range of motion. Neck supple.  Cardiovascular: Regular rhythm, S1 normal and S2 normal.  Exam reveals no gallop and no friction rub.   No murmur heard. Pulmonary/Chest: Effort normal and breath sounds normal. No respiratory distress. He exhibits no tenderness.  Abdominal: Soft. Normal appearance and bowel sounds are normal. There is no hepatosplenomegaly. There is no  tenderness. There is no rebound, no guarding, no tenderness at McBurney's point and negative Murphy's sign. No hernia.  Musculoskeletal:  Knee joint moderate effusion with tenderness No redness,  Warm to touch Resists ROM due to pain  Neurological: He is alert and oriented to person, place, and time. He has normal strength. No cranial nerve deficit or sensory deficit. Coordination normal. GCS eye subscore is 4. GCS verbal subscore is 5. GCS motor subscore is 6.  Skin: Skin is warm, dry and intact. No cyanosis.  Bilateral stasis dermatitis changes bilaterally in lower legs   Psychiatric: He has a normal mood and affect. His speech is normal and behavior is normal. Thought content normal.  Nursing note and vitals reviewed.    ED Treatments / Results  DIAGNOSTIC STUDIES: Oxygen Saturation is 98% on RA, normal by my interpretation.    COORDINATION OF CARE: 11:45 PM Discussed treatment plan with pt at bedside and pt agreed to plan. Labs (all labs ordered are listed, but only abnormal results are displayed) Labs Reviewed  CBC WITH DIFFERENTIAL/PLATELET - Abnormal; Notable for the following:       Result Value   WBC 12.5 (*)    Neutro Abs 8.7 (*)    Monocytes Absolute 1.1 (*)    All other components within normal limits  BASIC METABOLIC PANEL - Abnormal; Notable for the following:    Potassium 3.3 (*)    Glucose, Bld 140 (*)    All other components within normal limits  SEDIMENTATION RATE - Abnormal; Notable for the following:    Sed Rate 25 (*)    All other components within normal limits  SYNOVIAL CELL COUNT + DIFF, W/ CRYSTALS - Abnormal; Notable for the following:    Color, Synovial YELLOW (*)    Appearance-Synovial TURBID (*)    WBC, Synovial 840 (*)    All other components within normal limits  CULTURE, BODY FLUID-BOTTLE  BODY FLUID CULTURE  GRAM STAIN  C-REACTIVE PROTEIN  GLUCOSE, SYNOVIAL FLUID  PROTEIN, SYNOVIAL FLUID  MISC [facility] TEST (SEND OUT)  MISC [facility] TEST  (SEND OUT)    EKG  EKG Interpretation None       Radiology Dg Knee Complete 4 Views Right  Result Date: 10/27/2016 CLINICAL DATA:  Right knee pain and swelling for several days. Patient felt a pop yesterday. EXAM: RIGHT KNEE - COMPLETE 4+ VIEW COMPARISON:  None. FINDINGS: No evidence of fracture, dislocation, or joint effusion. No evidence of arthropathy or other focal bone abnormality. Soft tissues are unremarkable. IMPRESSION: Negative. Electronically Signed   By: Burman Nieves M.D.   On: 10/27/2016 00:36    Procedures .Joint Aspiration/Arthrocentesis Date/Time: 10/27/2016 5:42 AM Performed by: Gilda Crease Authorized by: Gilda Crease   Consent:    Consent obtained:  Written   Consent given by:  Patient  Risks discussed:  Infection and pain   Alternatives discussed:  Delayed treatment Universal protocol:    Procedure explained and questions answered to patient or proxy's satisfaction: yes     Relevant documents present and verified: yes     Test results available and properly labeled: yes     Imaging studies available: yes     Required blood products, implants, devices, and special equipment available: yes     Site/side marked: yes     Immediately prior to procedure, a time out was called: yes     Patient identity confirmed:  Verbally with patient Location:    Location:  Knee   Knee:  R knee Anesthesia (see MAR for exact dosages):    Anesthesia method:  Local infiltration   Local anesthetic:  Lidocaine 1% w/o epi Procedure details:    Preparation: Patient was prepped and draped in usual sterile fashion     Needle gauge:  18 G   Ultrasound guidance: no     Approach:  Medial   Aspirate characteristics:  Yellow   Steroid injected: no     Specimen collected: yes   Post-procedure details:    Dressing:  Adhesive bandage   (including critical care time)  Medications Ordered in ED Medications  lidocaine (PF) (XYLOCAINE) 1 % injection 5 mL (5  mLs Infiltration Given by Other 10/27/16 0159)  morphine 4 MG/ML injection 4 mg (4 mg Intravenous Given 10/27/16 0251)  ondansetron (ZOFRAN) injection 4 mg (4 mg Intravenous Given 10/27/16 0251)  ketorolac (TORADOL) 30 MG/ML injection 30 mg (30 mg Intravenous Given 10/27/16 0601)     Initial Impression / Assessment and Plan / ED Course  I have reviewed the triage vital signs and the nursing notes.  Pertinent labs & imaging results that were available during my care of the patient were reviewed by me and considered in my medical decision making (see chart for details).     Patient presents to the ER for evaluation of right knee pain. Patient reports that symptoms began yesterday. He was kneeling on a bed to reach above the bed and felt a pop. He started having swelling and now the knee is extremely swollen. He reports that he has had similar process in the past requiring arthrocentesis. He has never had a septic arthritis.  Patient did have difficulty with moving the joint because of pain. After arthrocentesis, however, range of motion was much improved. Patient has minimal white blood cells in the synovial fluid. This is not consistent with septic arthritis. He also has only minimally elevated sedimentation rate. CRP is still pending. At this point, synovial fluid analysis is consistent with inflammatory process, not infection. Patient will require close follow-up with orthopedics. He is to return to the ER for fever, worsening pain, redness of the joint.  Final Clinical Impressions(s) / ED Diagnoses   Final diagnoses:  Effusion of right knee    New Prescriptions New Prescriptions   IBUPROFEN (ADVIL,MOTRIN) 800 MG TABLET    Take 1 tablet (800 mg total) by mouth 3 (three) times daily.  I personally performed the services described in this documentation, which was scribed in my presence. The recorded information has been reviewed and is accurate.     Gilda Crease, MD 10/27/16  [phone]

## 2016-10-26 NOTE — ED Notes (Signed)
Pt states was on his knees yesterday & felt a pop in his right knee. Swelling noted & warm to the touch.

## 2016-10-27 LAB — CBC WITH DIFFERENTIAL/PLATELET
BASOS PCT: 0 %
Basophils Absolute: 0 10*3/uL (ref 0.0–0.1)
EOS ABS: 0.2 10*3/uL (ref 0.0–0.7)
Eosinophils Relative: 1 %
HCT: 46.4 % (ref 39.0–52.0)
HEMOGLOBIN: 15.8 g/dL (ref 13.0–17.0)
Lymphocytes Relative: 21 %
Lymphs Abs: 2.7 10*3/uL (ref 0.7–4.0)
MCH: 30 pg (ref 26.0–34.0)
MCHC: 34.1 g/dL (ref 30.0–36.0)
MCV: 88.2 fL (ref 78.0–100.0)
Monocytes Absolute: 1.1 10*3/uL — ABNORMAL HIGH (ref 0.1–1.0)
Monocytes Relative: 8 %
NEUTROS PCT: 70 %
Neutro Abs: 8.7 10*3/uL — ABNORMAL HIGH (ref 1.7–7.7)
PLATELETS: 191 10*3/uL (ref 150–400)
RBC: 5.26 MIL/uL (ref 4.22–5.81)
RDW: 14 % (ref 11.5–15.5)
WBC: 12.5 10*3/uL — AB (ref 4.0–10.5)

## 2016-10-27 LAB — BASIC METABOLIC PANEL
Anion gap: 10 (ref 5–15)
BUN: 7 mg/dL (ref 6–20)
CHLORIDE: 101 mmol/L (ref 101–111)
CO2: 28 mmol/L (ref 22–32)
CREATININE: 0.75 mg/dL (ref 0.61–1.24)
Calcium: 9.6 mg/dL (ref 8.9–10.3)
Glucose, Bld: 140 mg/dL — ABNORMAL HIGH (ref 65–99)
POTASSIUM: 3.3 mmol/L — AB (ref 3.5–5.1)
SODIUM: 139 mmol/L (ref 135–145)

## 2016-10-27 LAB — GRAM STAIN

## 2016-10-27 LAB — SEDIMENTATION RATE: Sed Rate: 25 mm/hr — ABNORMAL HIGH (ref 0–16)

## 2016-10-27 LAB — C-REACTIVE PROTEIN: CRP: 5.2 mg/dL — AB (ref <1.0)

## 2016-10-27 MED ORDER — MORPHINE SULFATE (PF) 4 MG/ML IV SOLN
4.0000 mg | Freq: Once | INTRAVENOUS | Status: AC
  Administered 2016-10-27: 4 mg via INTRAVENOUS
  Filled 2016-10-27: qty 1

## 2016-10-27 MED ORDER — KETOROLAC TROMETHAMINE 30 MG/ML IJ SOLN
30.0000 mg | Freq: Once | INTRAMUSCULAR | Status: AC
  Administered 2016-10-27: 30 mg via INTRAVENOUS
  Filled 2016-10-27: qty 1

## 2016-10-27 MED ORDER — IBUPROFEN 800 MG PO TABS: 800.0000 mg | ORAL_TABLET | Freq: Three times a day (TID) | ORAL | 0 refills | Status: DC

## 2016-10-27 MED ORDER — LIDOCAINE HCL (PF) 1 % IJ SOLN
5.0000 mL | Freq: Once | INTRAMUSCULAR | Status: AC
  Administered 2016-10-27: 5 mL
  Filled 2016-10-27: qty 5

## 2016-10-27 MED ORDER — ONDANSETRON HCL 4 MG/2ML IJ SOLN
4.0000 mg | Freq: Once | INTRAMUSCULAR | Status: AC
  Administered 2016-10-27: 4 mg via INTRAVENOUS
  Filled 2016-10-27: qty 2

## 2016-10-27 NOTE — Discharge Instructions (Signed)
Schedule follow-up with one of the above listed orthopedic doctors for as soon as possible this week. If you develop a fever, redness of the knee, increasing swelling or pain, return to the ER for repeat evaluation.

## 2016-10-27 NOTE — ED Notes (Signed)
Pt alert & oriented x4. Patient given discharge instructions, paperwork & prescription(s). Patient verbalized understanding. Pt left department in his personal power wheelchair. Pt left w/ no further questions.

## 2016-10-28 LAB — MISC LABCORP TEST (SEND OUT)
Labcorp test code: 19497
Labcorp test code: 19588

## 2016-11-01 LAB — CULTURE, BODY FLUID-BOTTLE

## 2016-11-01 LAB — SYNOVIAL CELL COUNT + DIFF, W/ CRYSTALS
CRYSTALS FLUID: NONE SEEN
Eosinophils-Synovial: 0 % (ref 0–1)
Lymphocytes-Synovial Fld: 0 % (ref 0–20)
MONOCYTE-MACROPHAGE-SYNOVIAL FLUID: 9 % — AB (ref 50–90)
Neutrophil, Synovial: 91 % — ABNORMAL HIGH (ref 0–25)
WBC, Synovial: 17550 /mm3 — ABNORMAL HIGH (ref 0–200)

## 2016-11-01 LAB — CULTURE, BODY FLUID W GRAM STAIN -BOTTLE
Culture: NO GROWTH
Special Requests: ADEQUATE

## 2016-11-09 ENCOUNTER — Emergency Department (HOSPITAL_COMMUNITY)
Admission: EM | Admit: 2016-11-09 | Discharge: 2016-11-10 | Disposition: A | Discharge status: Home / Self Care | Payer: Medicaid Other | Attending: Emergency Medicine | Admitting: Emergency Medicine

## 2016-11-09 ENCOUNTER — Emergency Department (HOSPITAL_COMMUNITY): Payer: Medicaid Other

## 2016-11-09 ENCOUNTER — Encounter (HOSPITAL_COMMUNITY): Payer: Self-pay | Admitting: Emergency Medicine

## 2016-11-09 DIAGNOSIS — R079 Chest pain, unspecified: Secondary | ICD-10-CM | POA: Diagnosis present

## 2016-11-09 DIAGNOSIS — R0789 Other chest pain: Secondary | ICD-10-CM

## 2016-11-09 DIAGNOSIS — E119 Type 2 diabetes mellitus without complications: Secondary | ICD-10-CM | POA: Insufficient documentation

## 2016-11-09 DIAGNOSIS — Z7984 Long term (current) use of oral hypoglycemic drugs: Secondary | ICD-10-CM | POA: Insufficient documentation

## 2016-11-09 DIAGNOSIS — Z79899 Other long term (current) drug therapy: Secondary | ICD-10-CM | POA: Diagnosis not present

## 2016-11-09 DIAGNOSIS — I10 Essential (primary) hypertension: Secondary | ICD-10-CM | POA: Insufficient documentation

## 2016-11-09 DIAGNOSIS — F17210 Nicotine dependence, cigarettes, uncomplicated: Secondary | ICD-10-CM | POA: Diagnosis not present

## 2016-11-09 MED ORDER — IBUPROFEN 400 MG PO TABS
600.0000 mg | ORAL_TABLET | Freq: Once | ORAL | Status: DC
  Filled 2016-11-09: qty 2

## 2016-11-09 MED ORDER — ACETAMINOPHEN 325 MG PO TABS
650.0000 mg | ORAL_TABLET | Freq: Once | ORAL | Status: DC
  Filled 2016-11-09: qty 2

## 2016-11-09 NOTE — ED Triage Notes (Signed)
cP for the last 3 hours - also with L Shoulder pain 4/10   Dr Maximino Greenlandon Dieago is PCP

## 2016-11-10 LAB — CBC WITH DIFFERENTIAL/PLATELET
BASOS ABS: 0 10*3/uL (ref 0.0–0.1)
Basophils Relative: 0 %
EOS ABS: 0.3 10*3/uL (ref 0.0–0.7)
EOS PCT: 4 %
HCT: 39.4 % (ref 39.0–52.0)
HEMOGLOBIN: 13.3 g/dL (ref 13.0–17.0)
LYMPHS PCT: 26 %
Lymphs Abs: 1.9 10*3/uL (ref 0.7–4.0)
MCH: 29.4 pg (ref 26.0–34.0)
MCHC: 33.8 g/dL (ref 30.0–36.0)
MCV: 87 fL (ref 78.0–100.0)
Monocytes Absolute: 0.4 10*3/uL (ref 0.1–1.0)
Monocytes Relative: 6 %
NEUTROS PCT: 64 %
Neutro Abs: 4.7 10*3/uL (ref 1.7–7.7)
PLATELETS: 154 10*3/uL (ref 150–400)
RBC: 4.53 MIL/uL (ref 4.22–5.81)
RDW: 13.7 % (ref 11.5–15.5)
WBC: 7.4 10*3/uL (ref 4.0–10.5)

## 2016-11-10 LAB — COMPREHENSIVE METABOLIC PANEL
ALT: 29 U/L (ref 17–63)
ANION GAP: 9 (ref 5–15)
AST: 33 U/L (ref 15–41)
Albumin: 3.7 g/dL (ref 3.5–5.0)
Alkaline Phosphatase: 55 U/L (ref 38–126)
BILIRUBIN TOTAL: 0.5 mg/dL (ref 0.3–1.2)
BUN: 7 mg/dL (ref 6–20)
CO2: 28 mmol/L (ref 22–32)
Calcium: 8.8 mg/dL — ABNORMAL LOW (ref 8.9–10.3)
Chloride: 104 mmol/L (ref 101–111)
Creatinine, Ser: 0.66 mg/dL (ref 0.61–1.24)
Glucose, Bld: 228 mg/dL — ABNORMAL HIGH (ref 65–99)
POTASSIUM: 3.7 mmol/L (ref 3.5–5.1)
Sodium: 141 mmol/L (ref 135–145)
TOTAL PROTEIN: 7 g/dL (ref 6.5–8.1)

## 2016-11-10 LAB — D-DIMER, QUANTITATIVE: D-Dimer, Quant: 0.41 ug/mL-FEU (ref 0.00–0.50)

## 2016-11-10 LAB — TROPONIN I

## 2016-11-10 NOTE — ED Provider Notes (Signed)
[facility] Provider Note   CSN: [id number] Arrival date & time: 11/09/16  2219  Time seen 23:15 PM   History   Chief Complaint Chief Complaint  Patient presents with  . Chest Pain    x 3 hours    HPI Bruce Love is a 41 y.o. male.  HPI  patient reports about 7 PM he started having pain in his right shoulder and he puts his hand over the top of his shoulder while sitting in his wheelchair eating french fries from [employer]. He states the pain is dull. Nothing he does makes it hurt more, sitting straight up makes it feel better as does rubbing it. He also states he feels like his left chest was "jumping". He denies having any chest pain. He also describes it as a pulsing feeling. He thinks maybe it was his heart or his chest muscles jumping. He denies shortness of breath, diaphoresis, nausea, vomiting, worsening swelling of his legs. Patient states he did have a lot of swelling of his legs about 5 months ago however he had a home health nurse come out and help treat his legs. He states they are much improved. He states he's never had this pain before. He is afraid that he is either having a heart attack or that his periumbilical hernia is acting up. He denies abdominal pain.  Patient states his mother who is 1760 has had open heart surgery with bypass grafting and has cardiac stents, he has a history of diabetes and hypertension. He also smokes a pack a day.  PCP Oval Linseyondiego, Richard, MD   Past Medical History:  Diagnosis Date  . Arthritis   . Cerebral palsy (HCC)   . Diabetes mellitus without complication (HCC)    type 2  . High cholesterol   . Hypertension   . Premature birth   . Umbilical hernia     Patient Active Problem List   Diagnosis Date Noted  . Cellulitis 12/10/2014  . Hypertension 12/10/2014  . Cerebral palsy (HCC) 12/10/2014  . High cholesterol 12/10/2014    Past Surgical History:  Procedure Laterality Date  . APPENDECTOMY    . HEEL SPUR SURGERY           Home Medications    Prior to Admission medications   Medication Sig Start Date End Date Taking? Authorizing Provider  acetaminophen (TYLENOL) 500 MG tablet Take 1,500 mg by mouth daily as needed for headache.   Yes [provider]  HYDROcodone-acetaminophen (NORCO) 10-325 MG tablet Take 1 tablet by mouth every 6 (six) hours as needed for moderate pain.    Yes [provider]  lisinopril (PRINIVIL,ZESTRIL) 40 MG tablet Take 40 mg by mouth daily.     Yes [provider]  metFORMIN (GLUCOPHAGE) 500 MG tablet Take 500 mg by mouth 2 (two) times daily with a meal.   Yes [provider]  pravastatin (PRAVACHOL) 40 MG tablet Take 40 mg by mouth at bedtime.    Yes [provider]  ibuprofen (ADVIL,MOTRIN) 800 MG tablet Take 1 tablet (800 mg total) by mouth 3 (three) times daily. Patient not taking: Reported on 11/09/2016 10/27/16   [name], Christopher J, MD    Family History Family History  Problem Relation Age of Onset  . Diabetes Other   . Hypertension Other   . Rheum arthritis Other     Social History Social History  Substance Use Topics  . Smoking status: Current Every Day Smoker    Packs/day: 1.00  Types: Cigarettes  . Smokeless tobacco: Never Used  . Alcohol use No  uses walker and wheelchair b/o cerebral palsy (balance issues)   Allergies   Seroquel [quetiapine fumarate]   Review of Systems Review of Systems  All other systems reviewed and are negative.    Physical Exam Updated Vital Signs BP 136/86 (BP Location: Right Arm)   Pulse 75   Temp 97.8 F (36.6 C) (Oral)   Resp 16   Ht 5\' 7"  (1.702 m)   Wt 95.3 kg (210 lb)   SpO2 97%   BMI 32.89 kg/m   Vital signs normal    Physical Exam  Constitutional: He is oriented to person, place, and time. He appears well-developed and well-nourished.  Non-toxic appearance. He does not appear ill. No distress.  HENT:  Head: Normocephalic and atraumatic.  Right Ear:  External ear normal.  Left Ear: External ear normal.  Nose: Nose normal. No mucosal edema or rhinorrhea.  Mouth/Throat: Oropharynx is clear and moist and mucous membranes are normal. No dental abscesses or uvula swelling.  Eyes: Conjunctivae and EOM are normal. Pupils are equal, round, and reactive to light.  Neck: Normal range of motion and full passive range of motion without pain. Neck supple.  Cardiovascular: Normal rate, regular rhythm and normal heart sounds.  Exam reveals no gallop and no friction rub.   No murmur heard. Pulmonary/Chest: Effort normal and breath sounds normal. No respiratory distress. He has no wheezes. He has no rhonchi. He has no rales. He exhibits no tenderness and no crepitus.    Areas of "chest jumping" noted  Abdominal: Soft. Normal appearance and bowel sounds are normal. He exhibits no distension. There is no tenderness. There is no rebound and no guarding.  Musculoskeletal: Normal range of motion. He exhibits no edema or tenderness.  Moves all extremities well.   Neurological: He is alert and oriented to person, place, and time. He has normal strength. No cranial nerve deficit.  Skin: Skin is warm, dry and intact. No rash noted. No erythema. No pallor.  Psychiatric: He has a normal mood and affect. His speech is normal and behavior is normal. His mood appears not anxious.  Nursing note and vitals reviewed.    ED Treatments / Results  Labs (all labs ordered are listed, but only abnormal results are displayed) Results for orders placed or performed during the hospital encounter of 11/09/16  Comprehensive metabolic panel  Result Value Ref Range   Sodium 141 135 - 145 mmol/L   Potassium 3.7 3.5 - 5.1 mmol/L   Chloride 104 101 - 111 mmol/L   CO2 28 22 - 32 mmol/L   Glucose, Bld 228 (H) 65 - 99 mg/dL   BUN 7 6 - 20 mg/dL   Creatinine, Ser 1.61 0.61 - 1.24 mg/dL   Calcium 8.8 (L) 8.9 - 10.3 mg/dL   Total Protein 7.0 6.5 - 8.1 g/dL   Albumin 3.7 3.5 - 5.0  g/dL   AST 33 15 - 41 U/L   ALT 29 17 - 63 U/L   Alkaline Phosphatase 55 38 - 126 U/L   Total Bilirubin 0.5 0.3 - 1.2 mg/dL   GFR calc non Af Amer >60 >60 mL/min   GFR calc Af Amer >60 >60 mL/min   Anion gap 9 5 - 15  CBC with Differential  Result Value Ref Range   WBC 7.4 4.0 - 10.5 K/uL   RBC 4.53 4.22 - 5.81 MIL/uL   Hemoglobin 13.3 13.0 - 17.0  g/dL   HCT 16.1 09.6 - 04.5 %   MCV 87.0 78.0 - 100.0 fL   MCH 29.4 26.0 - 34.0 pg   MCHC 33.8 30.0 - 36.0 g/dL   RDW 40.9 81.1 - 91.4 %   Platelets 154 150 - 400 K/uL   Neutrophils Relative % 64 %   Neutro Abs 4.7 1.7 - 7.7 K/uL   Lymphocytes Relative 26 %   Lymphs Abs 1.9 0.7 - 4.0 K/uL   Monocytes Relative 6 %   Monocytes Absolute 0.4 0.1 - 1.0 K/uL   Eosinophils Relative 4 %   Eosinophils Absolute 0.3 0.0 - 0.7 K/uL   Basophils Relative 0 %   Basophils Absolute 0.0 0.0 - 0.1 K/uL  Troponin I  Result Value Ref Range   Troponin I <0.03 <0.03 ng/mL  D-dimer, quantitative  Result Value Ref Range   D-Dimer, Quant 0.41 0.00 - 0.50 ug/mL-FEU  Troponin I  Result Value Ref Range   Troponin I <0.03 <0.03 ng/mL   Laboratory interpretation all normal    EKG  EKG Interpretation  Date/Time:  Friday November 09 2016 22:36:29 EDT Ventricular Rate:  77 PR Interval:    QRS Duration: 92 QT Interval:  376 QTC Calculation: 426 R Axis:   79 Text Interpretation:  Sinus rhythm Normal ECG No significant change since last tracing 10 Dec 2014 Confirmed by Devoria Albe ([id number]) on 11/09/2016 11:15:11 PM       Radiology Dg Chest 2 View  Result Date: 11/10/2016 CLINICAL DATA:  Chest pain, onset tonight. EXAM: CHEST  2 VIEW COMPARISON:  Radiographs 12/10/2014 FINDINGS: The cardiomediastinal contours are normal. The lungs are clear. Pulmonary vasculature is normal. No consolidation, pleural effusion, or pneumothorax. No acute osseous abnormalities are seen. IMPRESSION: No active cardiopulmonary disease. Electronically Signed   By: Rubye Oaks  M.D.   On: 11/10/2016 00:51    Procedures Procedures (including critical care time)  Medications Ordered in ED Medications  acetaminophen (TYLENOL) tablet 650 mg (not administered)  ibuprofen (ADVIL,MOTRIN) tablet 600 mg (not administered)     Initial Impression / Assessment and Plan / ED Course  I have reviewed the triage vital signs and the nursing notes.  Pertinent labs & imaging results that were available during my care of the patient were reviewed by me and considered in my medical decision making (see chart for details).  Patient was given acetaminophen and ibuprofen for pain while waiting for his test results. We discussed the laboratory tests that were resulted in his chest x-ray and EKG which were all normal.  Recheck at 1:40 AM patient states he's feeling much better. We discussed all of his test results which were normal. We discussed getting a delta troponin which was ordered for 2 AM which will be 2 hours after his initial troponin.  3:30 AM patient's delta troponin is negative. He was discharged home. He can take Motrin and Tylenol as needed for pain.  Final Clinical Impressions(s) / ED Diagnoses   Final diagnoses:  Atypical chest pain    New Prescriptions OTC acetaminophen/motrin  Plan discharge  Devoria Albe, MD, Concha Pyo, MD 11/10/16 [phone]

## 2016-11-10 NOTE — Discharge Instructions (Signed)
You can take acetaminophen 1000 mg + ibuprofen 600 mg every 6 hrs as needed for pain.  Recheck if you get a fever, shortness of breath or seem worse.

## 2017-01-14 ENCOUNTER — Emergency Department (HOSPITAL_COMMUNITY)
Admission: EM | Admit: 2017-01-14 | Discharge: 2017-01-14 | Disposition: A | Discharge status: Home / Self Care | Payer: Medicaid Other | Attending: Emergency Medicine | Admitting: Emergency Medicine

## 2017-01-14 ENCOUNTER — Encounter (HOSPITAL_COMMUNITY): Payer: Self-pay

## 2017-01-14 ENCOUNTER — Emergency Department (HOSPITAL_COMMUNITY): Payer: Medicaid Other

## 2017-01-14 DIAGNOSIS — R600 Localized edema: Secondary | ICD-10-CM | POA: Insufficient documentation

## 2017-01-14 DIAGNOSIS — I872 Venous insufficiency (chronic) (peripheral): Secondary | ICD-10-CM | POA: Insufficient documentation

## 2017-01-14 DIAGNOSIS — E119 Type 2 diabetes mellitus without complications: Secondary | ICD-10-CM | POA: Diagnosis not present

## 2017-01-14 DIAGNOSIS — I10 Essential (primary) hypertension: Secondary | ICD-10-CM | POA: Diagnosis not present

## 2017-01-14 DIAGNOSIS — F17210 Nicotine dependence, cigarettes, uncomplicated: Secondary | ICD-10-CM | POA: Diagnosis not present

## 2017-01-14 DIAGNOSIS — R609 Edema, unspecified: Secondary | ICD-10-CM

## 2017-01-14 LAB — BASIC METABOLIC PANEL
Anion gap: 13 (ref 5–15)
BUN: 13 mg/dL (ref 6–20)
CALCIUM: 9.8 mg/dL (ref 8.9–10.3)
CO2: 27 mmol/L (ref 22–32)
CREATININE: 0.88 mg/dL (ref 0.61–1.24)
Chloride: 101 mmol/L (ref 101–111)
GFR calc Af Amer: >60 mL/min (ref >60)
GLUCOSE: 183 mg/dL — AB (ref 65–99)
Potassium: 3.8 mmol/L (ref 3.5–5.1)
SODIUM: 141 mmol/L (ref 135–145)

## 2017-01-14 LAB — CBC WITH DIFFERENTIAL/PLATELET
BASOS ABS: 0 10*3/uL (ref 0.0–0.1)
BASOS PCT: 0 %
EOS PCT: 3 %
Eosinophils Absolute: 0.3 10*3/uL (ref 0.0–0.7)
HEMATOCRIT: 44.3 % (ref 39.0–52.0)
Hemoglobin: 15.1 g/dL (ref 13.0–17.0)
Lymphocytes Relative: 34 %
Lymphs Abs: 3.7 10*3/uL (ref 0.7–4.0)
MCH: 29.5 pg (ref 26.0–34.0)
MCHC: 34.1 g/dL (ref 30.0–36.0)
MCV: 86.5 fL (ref 78.0–100.0)
MONO ABS: 0.7 10*3/uL (ref 0.1–1.0)
MONOS PCT: 6 %
NEUTROS ABS: 6.1 10*3/uL (ref 1.7–7.7)
Neutrophils Relative %: 57 %
PLATELETS: 208 10*3/uL (ref 150–400)
RBC: 5.12 MIL/uL (ref 4.22–5.81)
RDW: 14.2 % (ref 11.5–15.5)
WBC: 10.8 10*3/uL — ABNORMAL HIGH (ref 4.0–10.5)

## 2017-01-14 LAB — BRAIN NATRIURETIC PEPTIDE: B NATRIURETIC PEPTIDE 5: 20 pg/mL (ref 0.0–100.0)

## 2017-01-14 NOTE — Discharge Instructions (Signed)
You were seen today with leg swelling. Continue to wrap the legs and keep them elevated. The redness seems to be from chronic swelling. There is no sign of infection at this time. Follow up with your PCP to discuss if you should start Lasix or not.   Return to the ED with any difficulty breathing, chest pain, fever, or chills.

## 2017-01-14 NOTE — ED Triage Notes (Signed)
Reports of bilateral leg swelling/reddness for several days. Denies pain, states legs itch.

## 2017-01-14 NOTE — ED Provider Notes (Signed)
Emergency Department Provider Note   I have reviewed the triage vital signs and the nursing notes.   HISTORY  Chief Complaint Leg Swelling   HPI Bruce Love is a 41 y.o. male with PMH of CP, DM, HLD, and HTN presents to the ED for evaluation of worsening LE edema. The patient reports being up all last night downloading music. He was sitting in his wheelchair and not elevating his legs. He states he typically does have leg swelling and redness. He still has some itching in the legs worsening over the past several days. No fevers or chills. No lower extremity pain. He feels like the legs are equally swollen has not noticed any chest pain or difficulty breathing. He had been applying hydrocortisone cream but a health aide today said that the legs looked more red than normal and referred him to the emergency department. No radiation of symptoms.    Past Medical History:  Diagnosis Date  . Arthritis   . Cerebral palsy (HCC)   . Diabetes mellitus without complication (HCC)    type 2  . High cholesterol   . Hypertension   . Premature birth   . Umbilical hernia     Patient Active Problem List   Diagnosis Date Noted  . Cellulitis 12/10/2014  . Hypertension 12/10/2014  . Cerebral palsy (HCC) 12/10/2014  . High cholesterol 12/10/2014    Past Surgical History:  Procedure Laterality Date  . APPENDECTOMY    . HEEL SPUR SURGERY      Current Outpatient Rx  . Order #: [id number] Class: Historical Med  . Order #: [id number] Class: Historical Med  . Order #: [id number] Class: Historical Med  . Order #: [id number] Class: Historical Med  . Order #: [id number] Class: Historical Med    Allergies Seroquel [quetiapine fumarate]  Family History  Problem Relation Age of Onset  . Diabetes Other   . Hypertension Other   . Rheum arthritis Other     Social History Social History  Substance Use Topics  . Smoking status: Current Every Day Smoker    Packs/day: 1.00    Types: Cigarettes  .  Smokeless tobacco: Never Used  . Alcohol use No    Review of Systems  Constitutional: No fever/chills Eyes: No visual changes. ENT: No sore throat. Cardiovascular: Denies chest pain. Respiratory: Denies shortness of breath. Gastrointestinal: No abdominal pain.  No nausea, no vomiting.  No diarrhea.  No constipation. Genitourinary: Negative for dysuria. Musculoskeletal: Negative for back pain. Positive B/L LE edema.  Skin: Chronic bilateral leg redness.  Neurological: Negative for headaches, focal weakness or numbness.  10-point ROS otherwise negative.  ____________________________________________   PHYSICAL EXAM:  VITAL SIGNS: ED Triage Vitals [01/14/17 1134]  Enc Vitals Group     BP (!) 154/85     Pulse Rate 87     Resp 17     Temp 97.9 F (36.6 C)     Temp Source Oral     SpO2 96 %     Weight 210 lb (95.3 kg)     Height 5\' 7"  (1.702 m)   Constitutional: Alert and oriented. Well appearing and in no acute distress. Eyes: Conjunctivae are normal. Head: Atraumatic. Nose: No congestion/rhinnorhea. Mouth/Throat: Mucous membranes are moist.  Neck: No stridor.   Cardiovascular: Normal rate, regular rhythm. Good peripheral circulation. Grossly normal heart sounds.   Respiratory: Normal respiratory effort.  No retractions. Lungs CTAB. Gastrointestinal: Soft and nontender. Positive distention with small, easily reducible umbilical hernia.  Musculoskeletal: Bilateral 3+ pitting  edema. No joint redness or warmth. Normal pulses and sensation.  Neurologic:  Normal speech and language. No gross focal neurologic deficits are appreciated.  Skin:  Skin is warm, dry and intact. Bilateral skin thickening equally in both LEs with mild erythema. No oozing or purulent drainage. No area of fluctuance. No tracking erythema proximally.   ____________________________________________   LABS (all labs ordered are listed, but only abnormal results are displayed)  Labs Reviewed  BASIC  METABOLIC PANEL - Abnormal; Notable for the following:       Result Value   Glucose, Bld 183 (*)    All other components within normal limits  CBC WITH DIFFERENTIAL/PLATELET - Abnormal; Notable for the following:    WBC 10.8 (*)    All other components within normal limits  BRAIN NATRIURETIC PEPTIDE   ____________________________________________  RADIOLOGY  Dg Chest 2 View  Result Date: 01/14/2017 CLINICAL DATA:  Productive cough, shortness of Breath EXAM: CHEST  2 VIEW COMPARISON:  11/10/2016 FINDINGS: Heart and mediastinal contours are within normal limits. No focal opacities or effusions. No acute bony abnormality. IMPRESSION: No active cardiopulmonary disease. Electronically Signed   By: Charlett Nose M.D.   On: 01/14/2017 12:56    ____________________________________________   PROCEDURES  Procedure(s) performed:   Procedures  None ____________________________________________   INITIAL IMPRESSION / ASSESSMENT AND PLAN / ED COURSE  Pertinent labs & imaging results that were available during my care of the patient were reviewed by me and considered in my medical decision making (see chart for details).   patient presents to the emergency department for evaluation of lower extremity edema and redness. He has equal swelling of bilateral lower extremities with associated venous stasis dermatitis. Patient reports that the legs look about his swollen as they normally are. No shortness of breath or chest pain symptoms. No unilateral swelling to suggest DVT. No unilateral erythema to suspect overlying cellulitis. Patient treats his lower extremity edema primarily with wrapping the legs and keeping them elevated when she is not been good about recently. He is not on Lasix. Plan for baseline labs and chest x-ray with reassessment.  01:05 PM Labs and CXR normal. No evidence of suggest cellulitis or DVT. Plan for leg elevation and compression stockings which the patient can purchase  locally. Will follow with PCP to decide on +/- Lasix but none now. No abx. Discussed return precautions in detail.   Specifically discussed smoking cessation with the patient in detail. He is actively trying to cut back with ultimate goal of quitting entirely.   At this time, I do not feel there is any life-threatening condition present. I have reviewed and discussed all results (EKG, imaging, lab, urine as appropriate), exam findings with patient. I have reviewed nursing notes and appropriate previous records.  I feel the patient is safe to be discharged home without further emergent workup. Discussed usual and customary return precautions. Patient and family (if present) verbalize understanding and are comfortable with this plan.  Patient will follow-up with their primary care provider. If they do not have a primary care provider, information for follow-up has been provided to them. All questions have been answered.  ____________________________________________  FINAL CLINICAL IMPRESSION(S) / ED DIAGNOSES  Final diagnoses:  Peripheral edema  Venous stasis dermatitis of both lower extremities     MEDICATIONS GIVEN DURING THIS VISIT:  Medications - No data to display   NEW OUTPATIENT MEDICATIONS STARTED DURING THIS VISIT:  None   Note:  This document was prepared using Dragon  voice recognition software and may include unintentional dictation errors.  Alona Bene, MD Emergency Medicine    Long, Arlyss Repress, MD 01/14/17 [phone]

## 2018-11-26 IMAGING — DX DG KNEE COMPLETE 4+V*R*
4 series · 4 of 4 positions shown · non-contrast
Comparison: None.

CLINICAL DATA: Right knee pain and swelling for several days.
Patient felt a pop yesterday.

EXAM:
RIGHT KNEE - COMPLETE 4+ VIEW

[knee ap (1 of 3)]
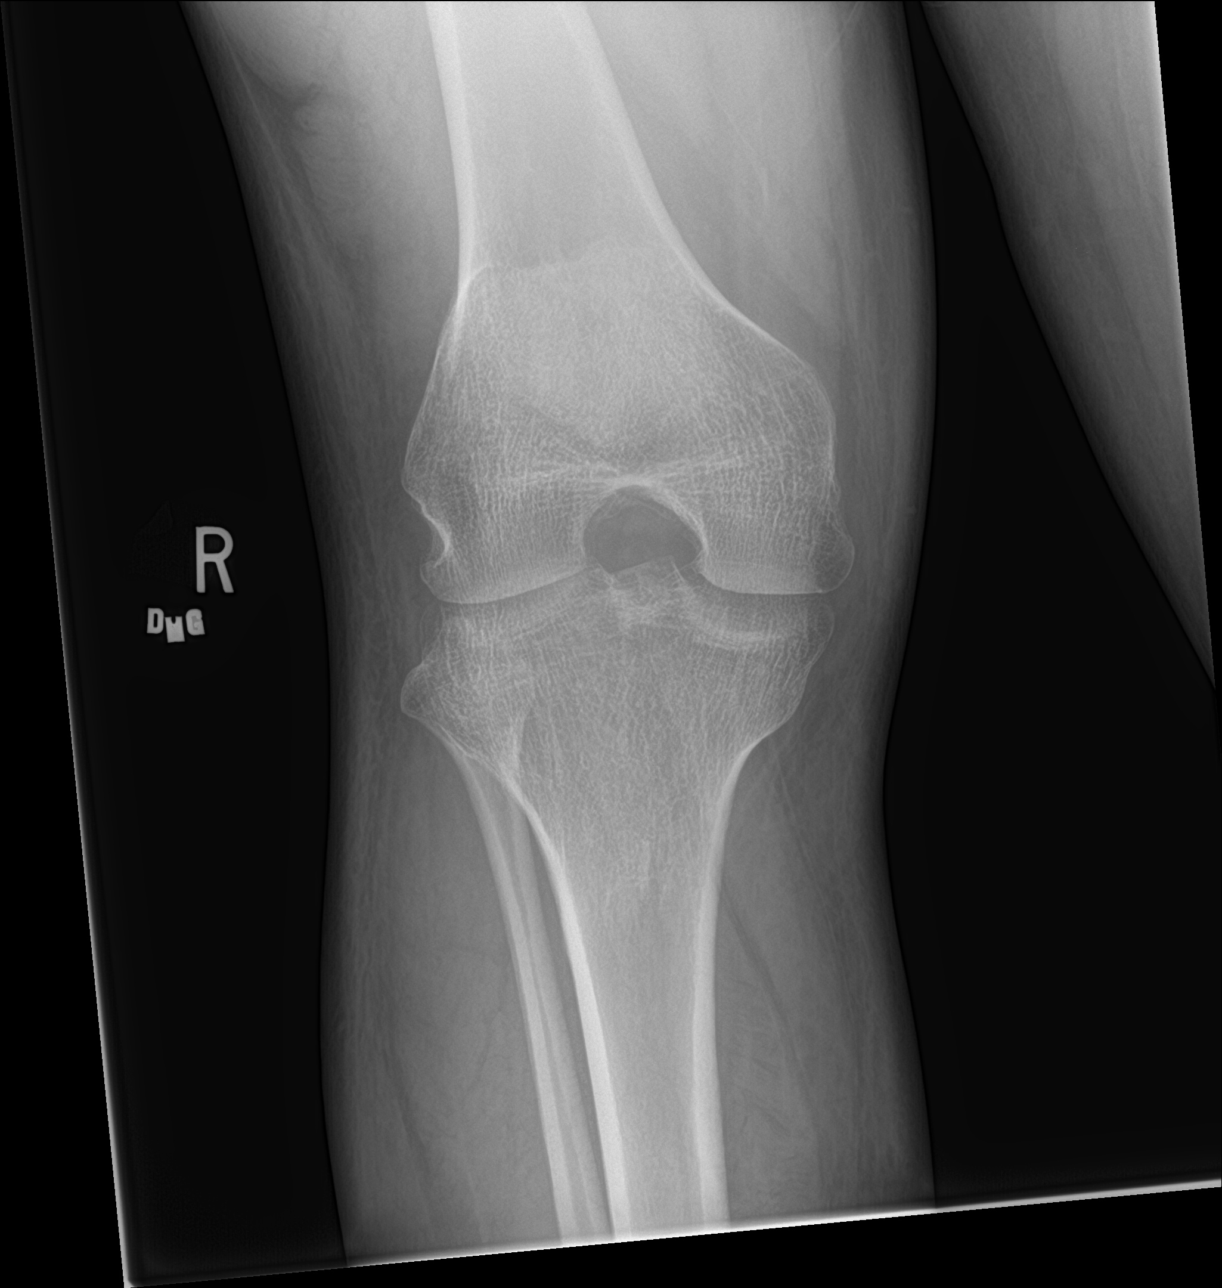

[knee lat]
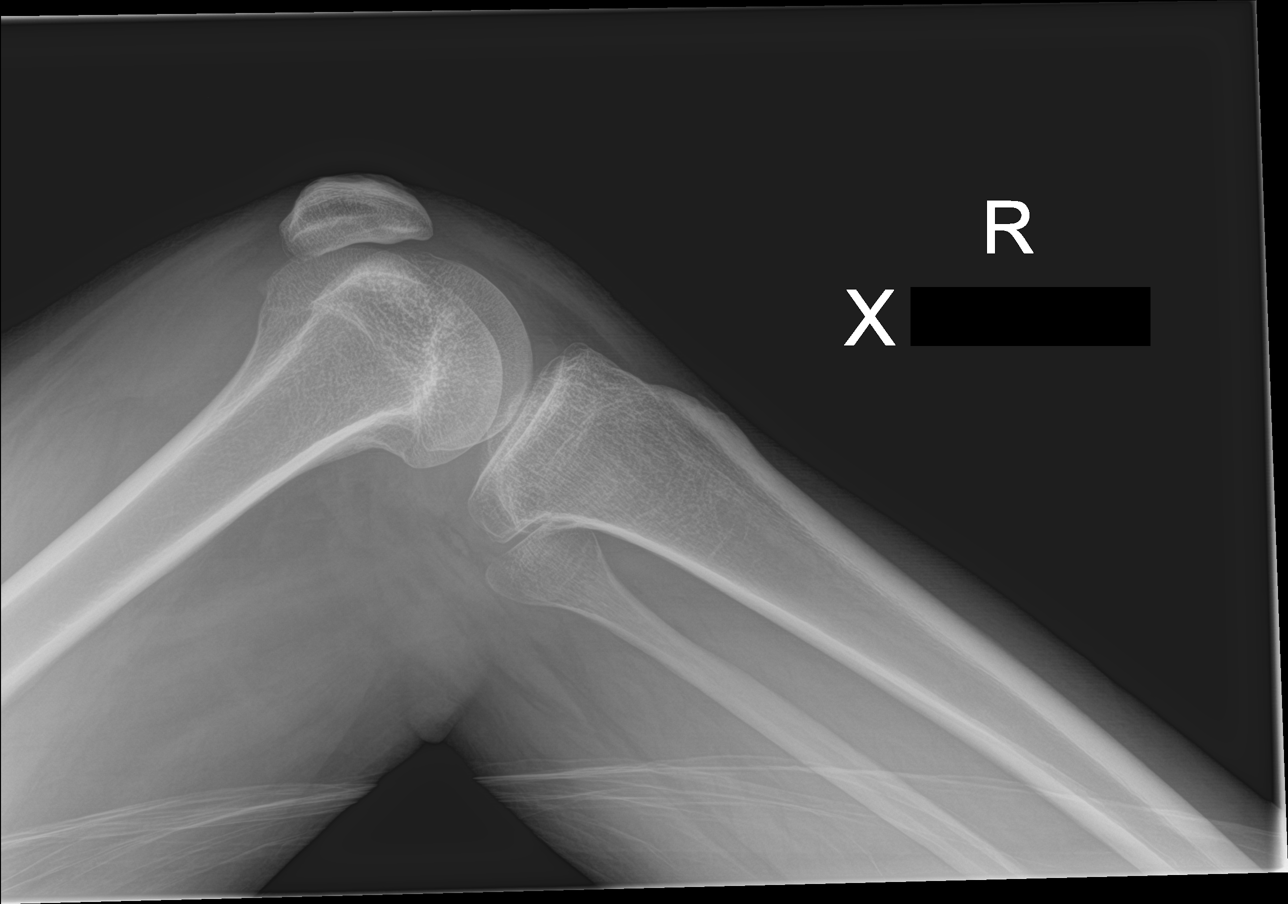

[knee ap (2 of 3)]
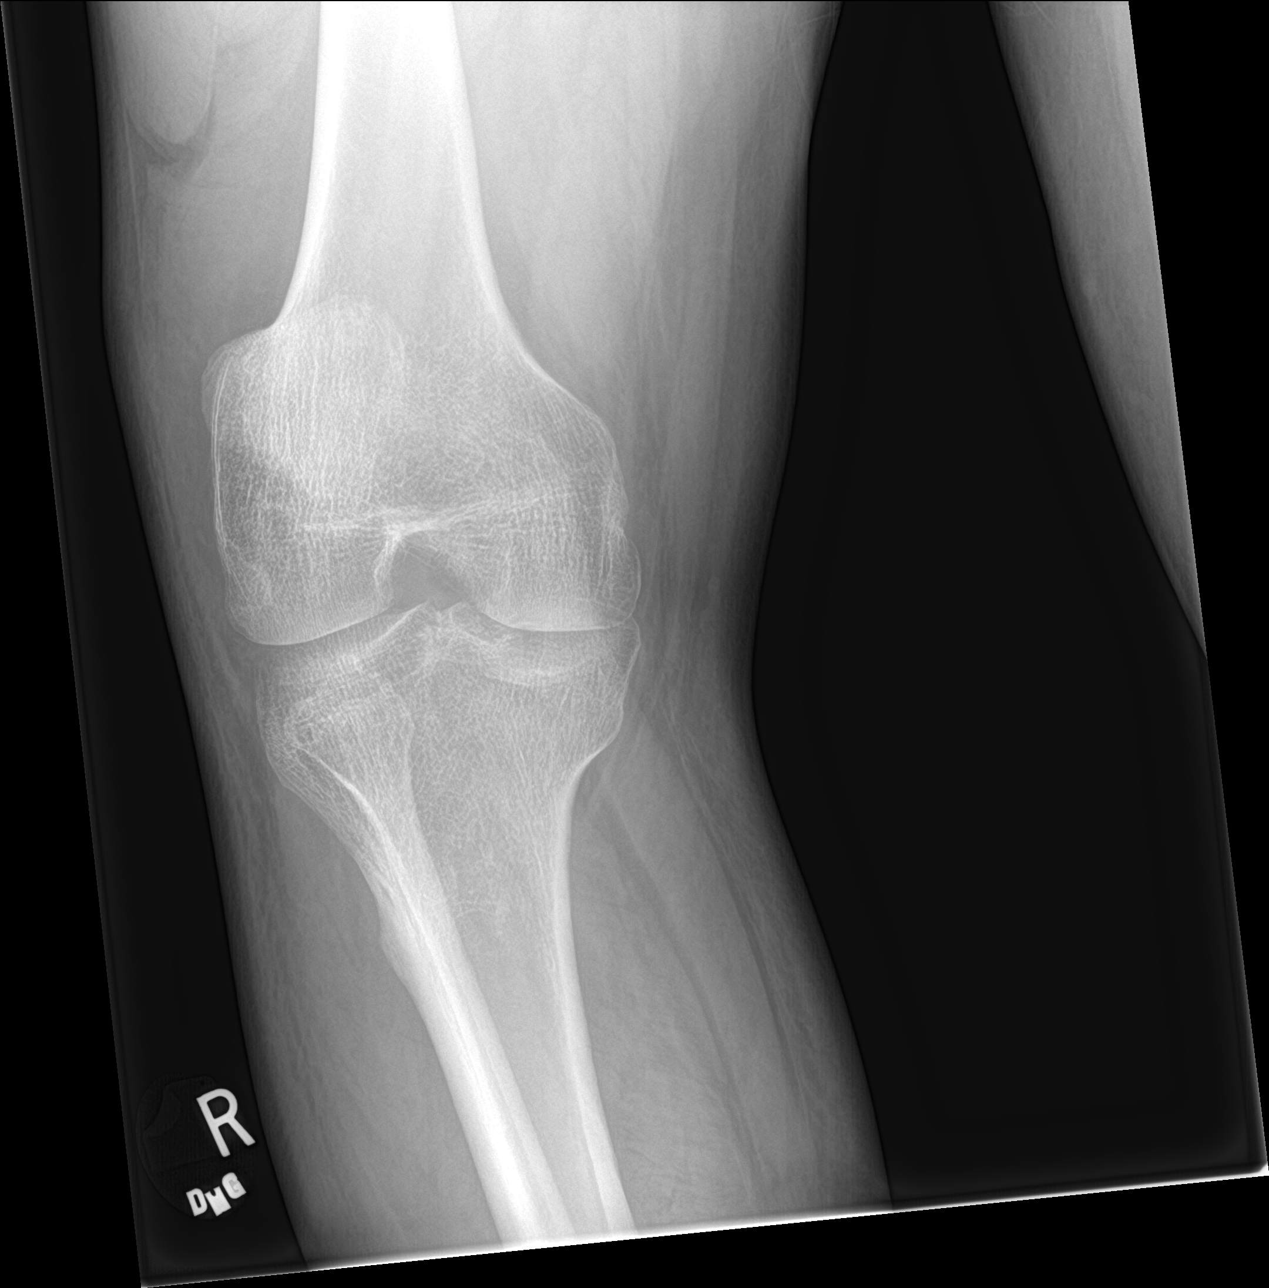

[knee ap (3 of 3)]
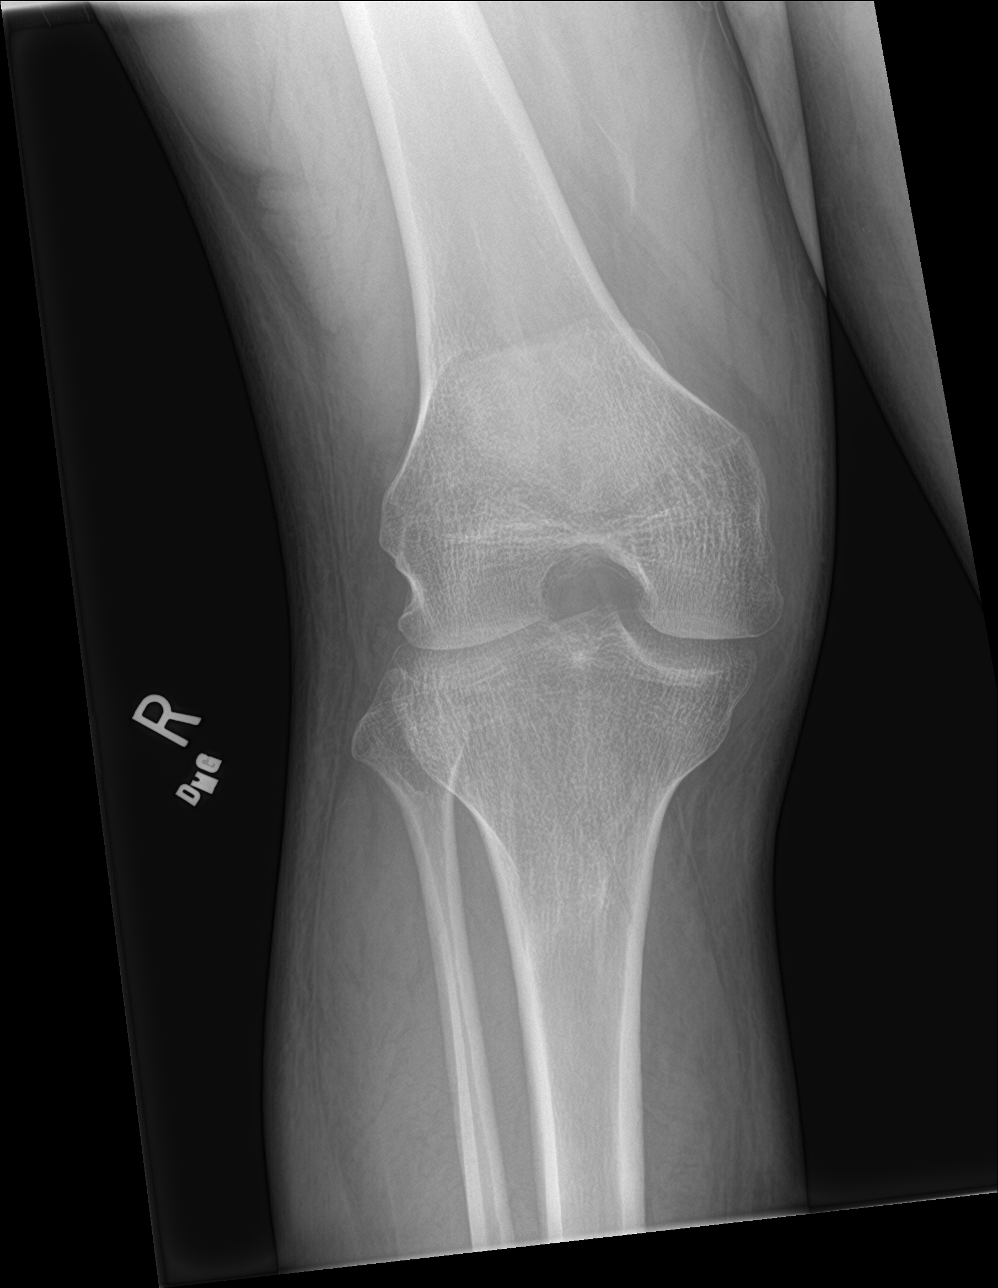

[4 of 4 positions shown; findings below may reference images not displayed]

FINDINGS: No evidence of fracture, dislocation, or joint effusion. No evidence
of arthropathy or other focal bone abnormality. Soft tissues are
unremarkable.
IMPRESSION: Negative.
